# Patient Record
Sex: Female | Born: 1955 | Race: White | Hispanic: No | Marital: Single | Smoking: Never smoker
Health system: Southern US, Community
[De-identification: ages and names within clinical notes are randomized; demographics above are authoritative.]

## PROBLEM LIST (undated history)

## (undated) DIAGNOSIS — I5189 Other ill-defined heart diseases: Secondary | ICD-10-CM

## (undated) DIAGNOSIS — H524 Presbyopia: Secondary | ICD-10-CM

## (undated) DIAGNOSIS — M545 Low back pain, unspecified: Secondary | ICD-10-CM

## (undated) DIAGNOSIS — R319 Hematuria, unspecified: Secondary | ICD-10-CM

## (undated) HISTORY — DX: Other ill-defined heart diseases: I51.89

## (undated) HISTORY — DX: Presbyopia: H52.4

## (undated) HISTORY — DX: Low back pain, unspecified: M54.50

## (undated) HISTORY — DX: Hematuria, unspecified: R31.9

---

## 2000-08-17 ENCOUNTER — Other Ambulatory Visit: Admission: RE | Admit: 2000-08-17 | Discharge: 2000-08-17 | Payer: Self-pay | Admitting: Obstetrics & Gynecology

## 2002-01-04 ENCOUNTER — Other Ambulatory Visit: Admission: RE | Admit: 2002-01-04 | Discharge: 2002-01-04 | Payer: Self-pay | Admitting: Gynecology

## 2003-03-14 ENCOUNTER — Other Ambulatory Visit: Admission: RE | Admit: 2003-03-14 | Discharge: 2003-03-14 | Payer: Self-pay | Admitting: Gynecology

## 2004-03-16 ENCOUNTER — Other Ambulatory Visit: Admission: RE | Admit: 2004-03-16 | Discharge: 2004-03-16 | Payer: Self-pay | Admitting: Gynecology

## 2004-05-11 ENCOUNTER — Ambulatory Visit (HOSPITAL_COMMUNITY): Admission: RE | Admit: 2004-05-11 | Discharge: 2004-05-11 | Payer: Self-pay | Admitting: Gynecology

## 2005-03-17 ENCOUNTER — Other Ambulatory Visit: Admission: RE | Admit: 2005-03-17 | Discharge: 2005-03-17 | Payer: Self-pay | Admitting: Gynecology

## 2005-05-12 ENCOUNTER — Ambulatory Visit (HOSPITAL_COMMUNITY): Admission: RE | Admit: 2005-05-12 | Discharge: 2005-05-12 | Payer: Self-pay | Admitting: Gynecology

## 2006-04-21 ENCOUNTER — Other Ambulatory Visit: Admission: RE | Admit: 2006-04-21 | Discharge: 2006-04-21 | Payer: Self-pay | Admitting: Gynecology

## 2007-01-02 ENCOUNTER — Emergency Department (HOSPITAL_COMMUNITY): Admission: EM | Admit: 2007-01-02 | Discharge: 2007-01-02 | Payer: Self-pay | Admitting: Family Medicine

## 2007-06-09 ENCOUNTER — Other Ambulatory Visit: Admission: RE | Admit: 2007-06-09 | Discharge: 2007-06-09 | Payer: Self-pay | Admitting: Gynecology

## 2008-07-24 ENCOUNTER — Emergency Department (HOSPITAL_COMMUNITY): Admission: EM | Admit: 2008-07-24 | Discharge: 2008-07-24 | Payer: Self-pay | Admitting: Family Medicine

## 2008-08-29 ENCOUNTER — Ambulatory Visit: Payer: Self-pay | Admitting: Women's Health

## 2008-08-29 ENCOUNTER — Other Ambulatory Visit: Admission: RE | Admit: 2008-08-29 | Discharge: 2008-08-29 | Payer: Self-pay | Admitting: Gynecology

## 2008-08-29 ENCOUNTER — Encounter: Payer: Self-pay | Admitting: Women's Health

## 2008-08-29 ENCOUNTER — Ambulatory Visit (HOSPITAL_COMMUNITY): Admission: RE | Admit: 2008-08-29 | Discharge: 2008-08-29 | Payer: Self-pay | Admitting: Obstetrics and Gynecology

## 2009-03-11 ENCOUNTER — Ambulatory Visit: Payer: Self-pay | Admitting: Gynecology

## 2009-04-06 ENCOUNTER — Emergency Department (HOSPITAL_COMMUNITY): Admission: EM | Admit: 2009-04-06 | Discharge: 2009-04-06 | Payer: Self-pay | Admitting: Emergency Medicine

## 2009-04-10 ENCOUNTER — Ambulatory Visit: Payer: Self-pay | Admitting: Gynecology

## 2009-12-12 ENCOUNTER — Other Ambulatory Visit: Admission: RE | Admit: 2009-12-12 | Discharge: 2009-12-12 | Payer: Self-pay | Admitting: Gynecology

## 2009-12-12 ENCOUNTER — Ambulatory Visit: Payer: Self-pay | Admitting: Women's Health

## 2010-05-02 ENCOUNTER — Encounter: Payer: Self-pay | Admitting: Gynecology

## 2010-05-03 ENCOUNTER — Encounter: Payer: Self-pay | Admitting: Gynecology

## 2010-05-03 ENCOUNTER — Encounter: Payer: Self-pay | Admitting: Obstetrics and Gynecology

## 2010-06-28 IMAGING — MG MM DIGITAL SCREENING BILAT
1 series · 1 of 1 positions shown · non-contrast
Comparison: Prior studies.

DG SCREEN MAMMOGRAM BILATERAL
Bilateral CC and MLO view(s) were taken.
Technologist: Torrens, Daphne.(INGA)(M)

DIGITAL SCREENING MAMMOGRAM WITH CAD:

[L MLO]
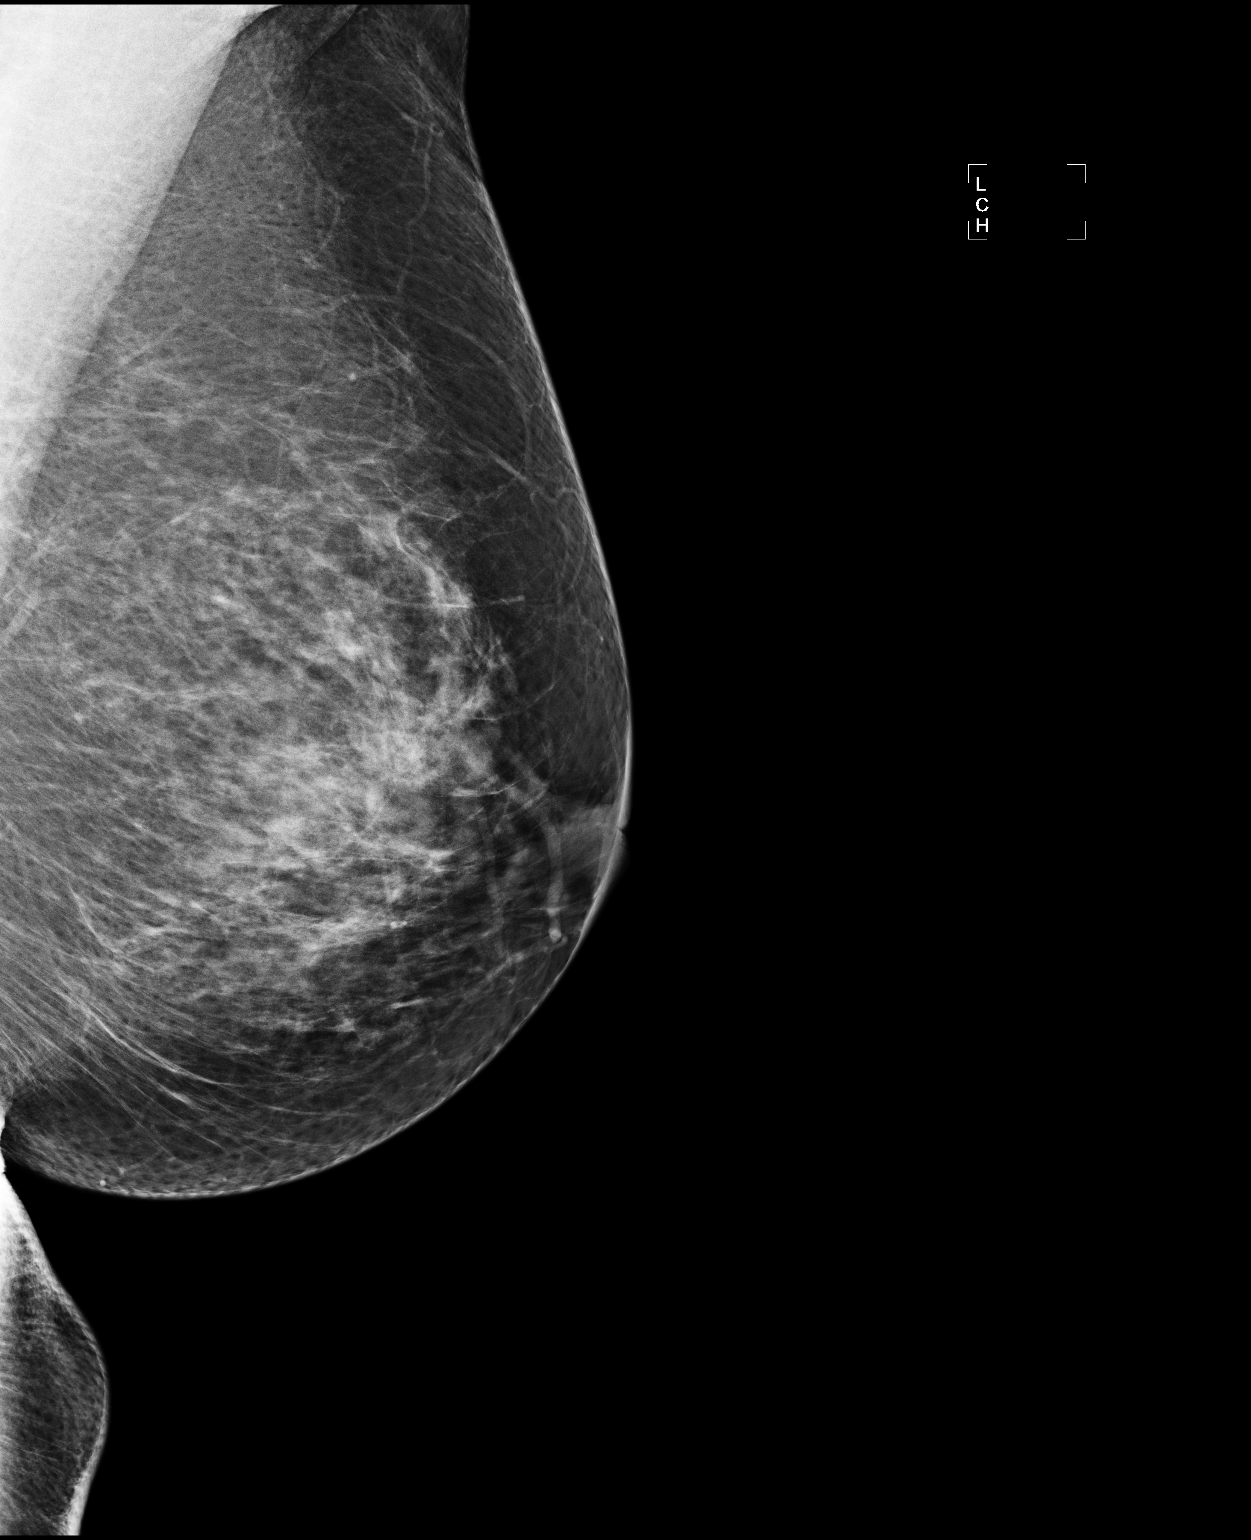

[1 of 1 positions shown; findings below may reference images not displayed]

The breast tissue is heterogeneously dense.  There is no dominant mass, architectural distortion or
calcification to suggest malignancy.
IMPRESSION: No mammographic evidence of malignancy.  Suggest yearly screening mammography.

A result letter of this screening mammogram will be mailed directly to the patient.

ASSESSMENT: Negative - BI-RADS 1

Screening mammogram in 1 year.
ANALYZED BY COMPUTER AIDED DETECTION. , THIS PROCEDURE WAS A DIGITAL MAMMOGRAM.

## 2010-07-13 LAB — POCT PREGNANCY, URINE: Preg Test, Ur: NEGATIVE

## 2010-09-25 ENCOUNTER — Ambulatory Visit: Payer: Self-pay | Admitting: Family Medicine

## 2011-01-21 LAB — POCT URINALYSIS DIP (DEVICE)
Ketones, ur: NEGATIVE
Operator id: 235561
Protein, ur: 30 — AB
Urobilinogen, UA: 0.2
pH: 7

## 2011-03-10 ENCOUNTER — Encounter: Payer: Self-pay | Admitting: *Deleted

## 2011-03-11 ENCOUNTER — Ambulatory Visit (INDEPENDENT_AMBULATORY_CARE_PROVIDER_SITE_OTHER): Payer: Managed Care, Other (non HMO) | Admitting: Women's Health

## 2011-03-11 ENCOUNTER — Other Ambulatory Visit (HOSPITAL_COMMUNITY)
Admission: RE | Admit: 2011-03-11 | Discharge: 2011-03-11 | Disposition: A | Discharge status: Home / Self Care | Payer: Managed Care, Other (non HMO) | Source: Ambulatory Visit | Attending: Obstetrics and Gynecology | Admitting: Obstetrics and Gynecology

## 2011-03-11 ENCOUNTER — Encounter: Payer: Self-pay | Admitting: Women's Health

## 2011-03-11 VITALS — BP 120/70 | Ht 62.0 in | Wt 180.0 lb

## 2011-03-11 DIAGNOSIS — Z01419 Encounter for gynecological examination (general) (routine) without abnormal findings: Secondary | ICD-10-CM | POA: Insufficient documentation

## 2011-03-11 DIAGNOSIS — Z780 Asymptomatic menopausal state: Secondary | ICD-10-CM

## 2011-03-11 DIAGNOSIS — N951 Menopausal and female climacteric states: Secondary | ICD-10-CM

## 2011-03-11 LAB — HM PAP SMEAR: HM PAP: NEGATIVE

## 2011-03-11 MED ORDER — ESTRADIOL-NORETHINDRONE ACET 0.5-0.1 MG PO TABS: 1.0000 | ORAL_TABLET | Freq: Every day | ORAL | Status: DC

## 2011-03-11 NOTE — Patient Instructions (Signed)
tdap vaccine  Colonoscopy-schedule

## 2011-03-11 NOTE — Progress Notes (Signed)
Emerson Barretto [facility] 02-27-1956 [id number]    History:    The patient presents for annual exam.  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   Past medical history, past surgical history, family history and social history were all reviewed and documented in the EPIC chart.   ROS:  A  ROS was performed and pertinent positives and negatives are included in the history.  Exam:  Filed Vitals:   03/11/11 1430  BP: 120/70    General appearance:  Normal Head/Neck:  Normal, without cervical or supraclavicular adenopathy. Thyroid:  Symmetrical, normal in size, without palpable masses or nodularity. Respiratory  Effort:  Normal  Auscultation:  Clear without wheezing or rhonchi Cardiovascular  Auscultation:  Regular rate, without rubs, murmurs or gallops  Edema/varicosities:  Not grossly evident Abdominal  Soft,nontender, without masses, guarding or rebound.  Liver/spleen:  No organomegaly noted  Hernia:  None appreciated  Skin  Inspection:  Grossly normal  Palpation:  Grossly normal Neurologic/psychiatric  Orientation:  Normal with appropriate conversation.  Mood/affect:  Normal  Genitourinary    Breasts: Examined lying and sitting.     Right: Without masses, retractions, discharge or axillary adenopathy.     Left: Without masses, retractions, discharge or axillary adenopathy.   Inguinal/mons:  Normal without inguinal adenopathy  External genitalia:  Normal  BUS/Urethra/Skene's glands:  Normal  Bladder:  Normal  Vagina:  Normal  Cervix:  Normal  Uterus:   Fibroid/8week size,  Midline and mobile  Adnexa/parametria:     Rt: Without masses or tenderness.   Lt: Without masses or tenderness.  Anus and perineum: Normal  Digital rectal exam: Normal sphincter tone without palpated masses or tenderness  Assessment/Plan:  55 y.o. S WF G0 for annual exam with no complaints. Postmenopausal on HRT with no bleeding. Has not had a colonoscopy. History of normal mammograms and Paps.  Normal  postmenopausal exam on HRT  Plan: Activella 0.5/0.1 prescription, proper use, slight risk for blood clots, strokes, breast cancer reviewed. Overdue for mammogram scheduled next week, reviewed importance of annual screening. Has a dermatology skin check appointment scheduled. Colonoscopy importance of screening reviewed, states will schedule next year. SBEs, annual screening, calcium rich diet, vitamin D 2000 daily encouraged, exercise, decrease calories for weight loss. DEXA will schedule. Pap only. Labs at primary care. Encouraged flu vaccine declines. Reviewed Tdap vaccine.  Harrington Challenger [facility], 3:13 PM 03/11/2011

## 2012-03-11 ENCOUNTER — Other Ambulatory Visit: Payer: Self-pay | Admitting: Women's Health

## 2012-03-13 NOTE — Telephone Encounter (Signed)
rx called in KW 

## 2012-06-15 ENCOUNTER — Encounter: Payer: Managed Care, Other (non HMO) | Admitting: Women's Health

## 2016-09-14 ENCOUNTER — Ambulatory Visit (INDEPENDENT_AMBULATORY_CARE_PROVIDER_SITE_OTHER): Payer: BLUE CROSS/BLUE SHIELD | Admitting: Adult Health

## 2016-09-14 ENCOUNTER — Encounter: Payer: Self-pay | Admitting: Adult Health

## 2016-09-14 VITALS — BP 123/76 | HR 69 | Ht 62.0 in | Wt 191.2 lb

## 2016-09-14 DIAGNOSIS — Z8349 Family history of other endocrine, nutritional and metabolic diseases: Secondary | ICD-10-CM

## 2016-09-14 DIAGNOSIS — Z833 Family history of diabetes mellitus: Secondary | ICD-10-CM | POA: Diagnosis not present

## 2016-09-14 DIAGNOSIS — Z1231 Encounter for screening mammogram for malignant neoplasm of breast: Secondary | ICD-10-CM

## 2016-09-14 DIAGNOSIS — E669 Obesity, unspecified: Secondary | ICD-10-CM | POA: Diagnosis not present

## 2016-09-14 DIAGNOSIS — Z83438 Family history of other disorder of lipoprotein metabolism and other lipidemia: Secondary | ICD-10-CM | POA: Insufficient documentation

## 2016-09-14 DIAGNOSIS — Z1239 Encounter for other screening for malignant neoplasm of breast: Secondary | ICD-10-CM | POA: Insufficient documentation

## 2016-09-14 DIAGNOSIS — R5383 Other fatigue: Secondary | ICD-10-CM | POA: Diagnosis not present

## 2016-09-14 NOTE — Progress Notes (Signed)
Subjective:    Patient ID: Michelle Wheeler, female    DOB: 04/28/1955, 61 y.o.   MRN: [id number]  HPI:  Ms. Michelle Wheeler presents to establish as a new pt.  She is a very pleasant 61 year old female.  PMH:  Denies chronic medical conditions or daily Rx medications.  She only takes OTC MVI and Vit d.  She has been a vegetarian for > 30 years.  She denies ETOH/Tobacco use.  She only drinks approx. 20 ounces of water daily, remainder of fluid intake is "1/2 sweetened tea".  She denies formal exercise, however is active with carrying for 3 year niece and works FT in [employer].  She is planning on starting a regular walking program. She has family hx of Diabetes and would like her A1c checked at her CPE.  Patient Care Team    Relationship Specialty Notifications Start End  WoodlawnDanford, Jinny BlossomKaty D, NP PCP - General Family Medicine  09/14/16     There are no active problems to display for this patient.    No past medical history on file.   No past surgical history on file.   Family History  Problem Relation Age of Onset  . Diabetes Father   . Cancer Father        kidney  . Hyperlipidemia Father   . Hypertension Father   . Diabetes Paternal Uncle   . Hypertension Paternal Uncle   . Diabetes Mother   . Diabetes Paternal Aunt   . Diabetes Paternal Uncle   . Hypertension Paternal Uncle   . Diabetes Paternal Uncle   . Hypertension Paternal Uncle      History  Drug Use No     History  Alcohol Use No     History  Smoking Status  . Never Smoker  Smokeless Tobacco  . Never Used     Outpatient Encounter Prescriptions as of 09/14/2016  Medication Sig  . Cholecalciferol (VITAMIN D3) 2000 units TABS Take 1 tablet by mouth daily.  . Multiple Vitamin (MULTIVITAMIN) tablet Take 1 tablet by mouth daily.  . [DISCONTINUED] Estradiol-Norethindrone Acet 0.5-0.1 MG per tablet TAKE 1 TABLET BY MOUTH DAILY.   No facility-administered encounter medications on file as of 09/14/2016.      Allergies: Codeine  Body mass index is 34.97 kg/m.  Blood pressure 123/76, pulse 69, height 5\' 2"  (1.575 m), weight 191 lb 3.2 oz (86.7 kg).     Review of Systems  Constitutional: Positive for fatigue. Negative for activity change, appetite change, chills, diaphoresis, fever and unexpected weight change.  Eyes: Negative for visual disturbance.  Respiratory: Negative for cough, chest tightness, shortness of breath, wheezing and stridor.   Cardiovascular: Negative for chest pain, palpitations and leg swelling.  Gastrointestinal: Negative for abdominal distention, abdominal pain, blood in stool, constipation, diarrhea, nausea and vomiting.  Endocrine: Negative for cold intolerance, heat intolerance, polydipsia, polyphagia and polyuria.  Genitourinary: Negative for difficulty urinating, flank pain and hematuria.  Musculoskeletal: Negative for arthralgias, back pain, gait problem, joint swelling, myalgias, neck pain and neck stiffness.  Skin: Negative for color change, pallor, rash and wound.  Allergic/Immunologic: Negative for immunocompromised state.  Neurological: Positive for headaches. Negative for weakness.       Hx of migraines, however has improved over lifetime.  Estimates 1-2 /annually  Hematological: Does not bruise/bleed easily.  Psychiatric/Behavioral: Negative for dysphoric mood and sleep disturbance.       Objective:   Physical Exam  Constitutional: She is oriented to person, place,  and time. She appears well-developed and well-nourished. No distress.  HENT:  Head: Normocephalic and atraumatic.  Right Ear: Hearing, tympanic membrane, external ear and ear canal normal.  Left Ear: Hearing, tympanic membrane, external ear and ear canal normal.  Nose: Nose normal. Right sinus exhibits no maxillary sinus tenderness and no frontal sinus tenderness. Left sinus exhibits no maxillary sinus tenderness and no frontal sinus tenderness.  Mouth/Throat: Uvula is midline.  Eyes:  Conjunctivae are normal. Pupils are equal, round, and reactive to light.  Neck: Normal range of motion. Neck supple.  Cardiovascular: Normal rate, regular rhythm, normal heart sounds and intact distal pulses.   No murmur heard. Pulmonary/Chest: Breath sounds normal. No respiratory distress. She has no wheezes. She has no rales. She exhibits no tenderness.  Musculoskeletal: Normal range of motion.  Lymphadenopathy:    She has no cervical adenopathy.  Neurological: She is alert and oriented to person, place, and time. Coordination normal.  Skin: Skin is warm and dry. No rash noted. She is not diaphoretic. No erythema. No pallor.  Psychiatric: She has a normal mood and affect. Her behavior is normal. Judgment and thought content normal.  Nursing note and vitals reviewed.         Assessment & Plan:   1. Screening for breast cancer   2. Family history of diabetes mellitus in father   3. Family history of hyperlipidemia   4. Other fatigue   5. Obesity (BMI 30.0-34.9)     Obesity (BMI 30.0-34.9) Continue vegetarian diet. Increase regular movement to at least 16mins/5 times daily. Increase water intake to at least 80 ounces daily.   Family history of hyperlipidemia Will check fasting lipid panel at CPE. Has been following vegetarian diet for > 30 years.   Other fatigue Will check Vit d, TSH level at CPE  Family history of diabetes mellitus in father Will check A1c at CPE  Screening for breast cancer Mammogram order placed.    FOLLOW-UP:  Return in about 1 year (around 09/14/2017) for Regular Follow Up, CPE, Lab Work.

## 2016-09-14 NOTE — Assessment & Plan Note (Signed)
Will check A1c at CPE 

## 2016-09-14 NOTE — Assessment & Plan Note (Signed)
Will check Vit d, TSH level at CPE

## 2016-09-14 NOTE — Assessment & Plan Note (Signed)
Will check fasting lipid panel at CPE. Has been following vegetarian diet for > 30 years.

## 2016-09-14 NOTE — Assessment & Plan Note (Signed)
Continue vegetarian diet. Increase regular movement to at least 2930mins/5 times daily. Increase water intake to at least 80 ounces daily.

## 2016-09-14 NOTE — Patient Instructions (Signed)
Exercising to Lose Weight Exercising can help you to lose weight. In order to lose weight through exercise, you need to do vigorous-intensity exercise. You can tell that you are exercising with vigorous intensity if you are breathing very hard and fast and cannot hold a conversation while exercising. Moderate-intensity exercise helps to maintain your current weight. You can tell that you are exercising at a moderate level if you have a higher heart rate and faster breathing, but you are still able to hold a conversation. How often should I exercise? Choose an activity that you enjoy and set realistic goals. Your health care provider can help you to make an activity plan that works for you. Exercise regularly as directed by your health care provider. This may include:  Doing resistance training twice each week, such as: ? Push-ups. ? Sit-ups. ? Lifting weights. ? Using resistance bands.  Doing a given intensity of exercise for a given amount of time. Choose from these options: ? 150 minutes of moderate-intensity exercise every week. ? 75 minutes of vigorous-intensity exercise every week. ? A mix of moderate-intensity and vigorous-intensity exercise every week.  Children, pregnant women, people who are out of shape, people who are overweight, and older adults may need to consult a health care provider for individual recommendations. If you have any sort of medical condition, be sure to consult your health care provider before starting a new exercise program. What are some activities that can help me to lose weight?  Walking at a rate of at least 4.5 miles an hour.  Jogging or running at a rate of 5 miles per hour.  Biking at a rate of at least 10 miles per hour.  Lap swimming.  Roller-skating or in-line skating.  Cross-country skiing.  Vigorous competitive sports, such as football, basketball, and soccer.  Jumping rope.  Aerobic dancing. How can I be more active in my day-to-day  activities?  Use the stairs instead of the elevator.  Take a walk during your lunch break.  If you drive, park your car farther away from work or school.  If you take public transportation, get off one stop early and walk the rest of the way.  Make all of your phone calls while standing up and walking around.  Get up, stretch, and walk around every 30 minutes throughout the day. What guidelines should I follow while exercising?  Do not exercise so much that you hurt yourself, feel dizzy, or get very short of breath.  Consult your health care provider prior to starting a new exercise program.  Wear comfortable clothes and shoes with good support.  Drink plenty of water while you exercise to prevent dehydration or heat stroke. Body water is lost during exercise and must be replaced.  Work out until you breathe faster and your heart beats faster. This information is not intended to replace advice given to you by your health care provider. Make sure you discuss any questions you have with your health care provider. Document Released: 05/01/2010 Document Revised: 09/04/2015 Document Reviewed: 08/30/2013 Elsevier Interactive Patient Education  2018 [employer]  Overall GREAT JOB on healthy lifestyle! Increase regular movement and increase water consumption to at least 80 ounces daily. Please schedule fasting lab and complete physical at your convenience. VERY NICE TO MEET YOU!

## 2016-09-14 NOTE — Assessment & Plan Note (Signed)
Mammogram order placed

## 2016-09-24 ENCOUNTER — Other Ambulatory Visit: Payer: Managed Care, Other (non HMO)

## 2017-05-16 NOTE — Progress Notes (Signed)
This encounter was created in error - please disregard.

## 2017-05-17 ENCOUNTER — Ambulatory Visit: Payer: 59

## 2017-05-17 ENCOUNTER — Encounter (INDEPENDENT_AMBULATORY_CARE_PROVIDER_SITE_OTHER): Payer: Self-pay | Admitting: Podiatry

## 2017-05-17 DIAGNOSIS — M722 Plantar fascial fibromatosis: Secondary | ICD-10-CM

## 2017-07-06 ENCOUNTER — Ambulatory Visit: Payer: 59 | Admitting: Women's Health

## 2017-07-07 ENCOUNTER — Telehealth (HOSPITAL_COMMUNITY): Payer: Self-pay

## 2017-07-07 ENCOUNTER — Ambulatory Visit (INDEPENDENT_AMBULATORY_CARE_PROVIDER_SITE_OTHER): Payer: 59 | Admitting: Adult Health

## 2017-07-07 ENCOUNTER — Encounter: Payer: Self-pay | Admitting: Adult Health

## 2017-07-07 VITALS — BP 134/82 | HR 62 | Ht 62.0 in | Wt 191.6 lb

## 2017-07-07 DIAGNOSIS — R079 Chest pain, unspecified: Secondary | ICD-10-CM | POA: Diagnosis not present

## 2017-07-07 DIAGNOSIS — S29011A Strain of muscle and tendon of front wall of thorax, initial encounter: Secondary | ICD-10-CM

## 2017-07-07 LAB — EKG 12-LEAD

## 2017-07-07 MED ORDER — CYCLOBENZAPRINE HCL 10 MG PO TABS: 10.0000 mg | ORAL_TABLET | Freq: Three times a day (TID) | ORAL | 0 refills | Status: DC | PRN

## 2017-07-07 NOTE — Telephone Encounter (Signed)
Encounter complete. 

## 2017-07-07 NOTE — Assessment & Plan Note (Signed)
Please continue Ibuprofen as directed by manufacturer's instructions! Please use Cyclobenzaprine as needed.

## 2017-07-07 NOTE — Assessment & Plan Note (Addendum)
EKG- normal with non-specific T wave abnormality Cardiac Stress Test Ordered No previous tracings to compare to If you notice and Red Flag symptoms (ie severe chest pain, nausea, sweating, pain in jaw, pain between shoulder blades, pain radiating down arms) then please seek immediate medical care. Please call clinic with any questions/concerns.

## 2017-07-07 NOTE — Progress Notes (Signed)
Subjective:    Patient ID: Michelle Wheeler, female    DOB: [birth year], 62 y.o.   MRN: [id number]  HPI:  Michelle Wheeler presents with central chest pain that suddenly started at 0300. Pain first started in chest then radiated to middle abdomen, described as "sharp", rated 10/10.  She denies diaphoresis or pain in jaw, between shoulder blades or arms. She did experience brief nausea without vomiting. She took 2 Advil at 0900, pain now 3/10 and just localized over central chest. She reports eating single serving of yogurt just prior to bed. She reports "being jerked by my dog" yesterday while walking him.  She has a 65 lb herding dog. She denies any tobacco use or current EOTH use. She denies this ever occurring before. She reports her mother had PCI with stent about 6 months ago, at age 1.  Mother has advanced COPD/emphysema and is one oxygen therapy 24/7. She denies palpitations/dyspnea at current. EKG- NSR with nonspecific T wave abnormality Recommend Stress Test- order placed  Patient Care Team    Relationship Specialty Notifications Start End  Julaine Fusi, NP PCP - General Family Medicine  09/14/16     Patient Active Problem List   Diagnosis Date Noted  . Chest pain 07/07/2017  . Chest wall muscle strain 07/07/2017  . Obesity (BMI 30.0-34.9) 09/14/2016  . Family history of hyperlipidemia 09/14/2016  . Other fatigue 09/14/2016  . Family history of diabetes mellitus in father 09/14/2016  . Screening for breast cancer 09/14/2016     History reviewed. No pertinent past medical history.   History reviewed. No pertinent surgical history.   Family History  Problem Relation Age of Onset  . Diabetes Father   . Cancer Father        kidney  . Hyperlipidemia Father   . Hypertension Father   . Diabetes Paternal Uncle   . Hypertension Paternal Uncle   . Diabetes Mother   . Diabetes Paternal Aunt   . Diabetes Paternal Uncle   . Hypertension Paternal Uncle   . Diabetes Paternal  Uncle   . Hypertension Paternal Uncle      Social History   Substance and Sexual Activity  Drug Use No     Social History   Substance and Sexual Activity  Alcohol Use No     Social History   Tobacco Use  Smoking Status Never Smoker  Smokeless Tobacco Never Used     Outpatient Encounter Medications as of 07/07/2017  Medication Sig  . Cholecalciferol (VITAMIN D3) 2000 units TABS Take 1 tablet by mouth daily.  . Multiple Vitamin (MULTIVITAMIN) tablet Take 1 tablet by mouth daily.  . norethindrone-ethinyl estradiol (JINTELI) 1-5 MG-MCG TABS tablet   . cyclobenzaprine (FLEXERIL) 10 MG tablet Take 1 tablet (10 mg total) by mouth 3 (three) times daily as needed for muscle spasms.   No facility-administered encounter medications on file as of 07/07/2017.     Allergies: Codeine  Body mass index is 35.04 kg/m.  Blood pressure 134/82, pulse 62, height 5\' 2"  (1.575 m), weight 191 lb 9.6 oz (86.9 kg), last menstrual period 04/09/2010, SpO2 98 %.    Review of Systems  Constitutional: Positive for activity change and fatigue. Negative for appetite change, chills, diaphoresis, fever and unexpected weight change.  Eyes: Negative for visual disturbance.  Respiratory: Positive for chest tightness. Negative for cough, shortness of breath, wheezing and stridor.   Cardiovascular: Positive for chest pain. Negative for palpitations and leg swelling.  Gastrointestinal: Negative for  abdominal distention, abdominal pain, blood in stool, constipation, diarrhea, nausea and vomiting.  Genitourinary: Negative for difficulty urinating and flank pain.  Musculoskeletal: Positive for myalgias.  Neurological: Negative for dizziness and headaches.  Hematological: Does not bruise/bleed easily.       Objective:   Physical Exam  Constitutional: She is oriented to person, place, and time. She appears well-developed and well-nourished.  Non-toxic appearance. She does not have a sickly appearance.  She does not appear ill. She appears distressed.  HENT:  Head: Normocephalic and atraumatic.  Right Ear: External ear normal.  Left Ear: External ear normal.  Eyes: Pupils are equal, round, and reactive to light. Conjunctivae are normal.  Cardiovascular: Normal rate, regular rhythm, normal heart sounds and intact distal pulses.  No murmur heard. Pulmonary/Chest: Effort normal and breath sounds normal. No respiratory distress. She has no wheezes. She has no rales. She exhibits no tenderness.  Abdominal: Soft. Bowel sounds are normal. She exhibits no distension and no mass. There is no tenderness. There is no rebound and no guarding.  Musculoskeletal: She exhibits tenderness.  Tenderness over medial pectoralis major with palpation   Neurological: She is alert and oriented to person, place, and time.  Skin: Skin is warm and dry. No rash noted. She is not diaphoretic. No erythema. No pallor.  Psychiatric: She has a normal mood and affect. Her behavior is normal. Judgment and thought content normal.  Nursing note and vitals reviewed.      Assessment & Plan:   1. Chest pain, unspecified type   2. Muscle strain of chest wall, initial encounter     Chest pain EKG- normal with non-specific T wave abnormality Cardiac Stress Test Ordered No previous tracings to compare to If you notice and Red Flag symptoms (ie severe chest pain, nausea, sweating, pain in jaw, pain between shoulder blades, pain radiating down arms) then please seek immediate medical care. Please call clinic with any questions/concerns.  Chest wall muscle strain Please continue Ibuprofen as directed by manufacturer's instructions! Please use Cyclobenzaprine as needed.   Pt was in the office today for 25+ minutes, with over 50% time spent in face to face counseling of patient's various medical conditions and in coordination of care  FOLLOW-UP:  Return if symptoms worsen or fail to improve.  CPE with fasting labs  09/2017

## 2017-07-07 NOTE — Patient Instructions (Signed)
Pectoralis Major Rupture A pectoralis major rupture is a tear (rupture) in one of the muscles in the chest (pectoralis major). There are two of these muscles in the chest, one on each side. The pectoralis major muscleshelp to straighten the arms and push forward. A pectoralis major rupture often tears the muscle away from bone along the breastbone, collarbone, or upper arm. This injury causes pain and weakness in the chest and shoulder. What are the causes? This condition is caused by putting too much stress on the pectoralis major muscle. This is most commonly a result of weight lifting. What increases the risk? This condition is more likely to develop in men, especially men who use anabolic steroids. This condition is also more likely to develop in people who participate in any of the following activities:  Weight lifting.  Rugby.  Football.  Gymnastics.  Skiing.  Wrestling.  Boxing.  Hockey.  What are the signs or symptoms? At the time of the rupture, you may hear a pop and feel a tearing pain. Other signs and symptoms may include:  Swelling.  Bruising.  Weakness.  A bulge or abnormality in the muscle (deformity).  Pain and tenderness when pressing on the muscle.  How is this diagnosed? This condition may be diagnosed based on:  Your symptoms.  Your medical history.  A physical exam. Your health care provider may: ? Compare one side of your chest to the other. ? Check for weakness, bruising, and tenderness.  Imaging tests to find the exact location of the rupture and check how severe it is. Tests may include: ? MRI. ? Ultrasound.  How is this treated? In young, active people and athletes, this condition is usually treated with surgery to reattach the muscle and repair the rupture. If your rupture is minor, or if you are older, you may not need surgery. Non-surgical treatment may include:  Wearing a sling for 3-6 weeks to keep your arm still  (immobilization).  NSAIDs to help relieve pain and swelling.  Physical therapy to improve your range of motion and strength.  Follow these instructions at home: If you have a sling:  Wear it as told by your health care provider. Remove it only as told by your health care provider.  Loosen the sling if your fingers tingle, become numb, or turn cold and blue.  Do not let your sling get wet if it is not waterproof.  Keep the sling clean. Bathing  Do not take baths, swim, or use a hot tub until your health care provider approves. Ask your health care provider if you can take showers. You may only be allowed to take sponge baths for bathing.  If your sling is not waterproof, cover it with a watertight covering when you take a bath or a shower. Managing pain, stiffness, and swelling   If directed, apply ice to your injured area. ? Put ice in a plastic bag. ? Place a towel between your skin and the bag. ? Leave the ice on for 20 minutes, 2-3 times a day.  Move your fingers often to avoid stiffness and to lessen swelling. Driving  Do not drive or operate heavy machinery while taking prescription pain medicine.  Ask your health care provider when it is safe to drive if you have a sling on your arm. Activity  Return to your normal activities as told by your health care provider. Ask your health care provider what activities are safe for you.  Do exercises as told by  your health care provider. General instructions  Take over-the-counter and prescription medicines only as told by your health care provider.  Keep all follow-up visits as told by your health care provider. This is important. How is this prevented?  Do not use anabolic steroids.  If you start a weight lifting program, make sure that you have supervision. As your fitness improves, you may add weight gradually.  Warm up and stretch before being active.  Cool down and stretch after being active.  Give your body  time to rest between periods of activity.  Make sure to use equipment that fits you.  Be safe and responsible while being active to avoid falls.  Maintain physical fitness, including: ? Strength. ? Flexibility. ? Endurance. Contact a health care provider if:  You continue to have pain, swelling, or weakness after 4 weeks.  Exercising makes your symptoms worse. This information is not intended to replace advice given to you by your health care provider. Make sure you discuss any questions you have with your health care provider. Document Released: 03/29/2005 Document Revised: 01/08/2016 Document Reviewed: 12/15/2014 Elsevier Interactive Patient Education  2018 [employer].  Please continue Ibuprofen as directed by [employer] instructions! Please use Cyclobenzaprine as needed. EKG- normal with non-specific T wave abnormality Cardiac Stress Test Ordered If you notice and Red Flag symptoms (ie severe chest pain, nausea, sweating, pain in jaw, pain between shoulder blades, pain radiating down arms) then please seek immediate medical care. Please call clinic with any questions/concerns. FEEL BETTER!

## 2017-07-13 ENCOUNTER — Encounter (HOSPITAL_COMMUNITY): Payer: 59

## 2017-07-15 ENCOUNTER — Telehealth (HOSPITAL_COMMUNITY): Payer: Self-pay

## 2017-07-15 NOTE — Telephone Encounter (Signed)
Encounter complete. 

## 2017-07-20 ENCOUNTER — Ambulatory Visit (HOSPITAL_COMMUNITY)
Admission: RE | Admit: 2017-07-20 | Discharge: 2017-07-20 | Disposition: A | Discharge status: Home / Self Care | Payer: 59 | Source: Ambulatory Visit | Attending: Cardiovascular Disease | Admitting: Cardiovascular Disease

## 2017-07-20 DIAGNOSIS — R079 Chest pain, unspecified: Secondary | ICD-10-CM | POA: Diagnosis present

## 2017-07-20 LAB — MYOCARDIAL PERFUSION IMAGING
CHL CUP NUCLEAR SDS: 1
CHL CUP NUCLEAR SRS: 1
CHL CUP NUCLEAR SSS: 2
CHL CUP RESTING HR STRESS: 65 {beats}/min
CSEPEDS: 1 s
CSEPEW: 10.1 METS
CSEPHR: 91 %
CSEPPHR: 146 {beats}/min
Exercise duration (min): 8 min
LV dias vol: 80 mL (ref 46–106)
LVSYSVOL: 29 mL
MPHR: 159 {beats}/min
RPE: 17
TID: 1.05

## 2017-07-20 MED ORDER — TECHNETIUM TC 99M TETROFOSMIN IV KIT
30.5000 | PACK | Freq: Once | INTRAVENOUS | Status: AC | PRN
  Administered 2017-07-20: 30.5 via INTRAVENOUS
  Filled 2017-07-20: qty 31

## 2017-07-20 MED ORDER — TECHNETIUM TC 99M TETROFOSMIN IV KIT
10.6000 | PACK | Freq: Once | INTRAVENOUS | Status: AC | PRN
  Administered 2017-07-20: 10.6 via INTRAVENOUS
  Filled 2017-07-20: qty 11

## 2017-08-01 NOTE — Progress Notes (Signed)
Subjective:    Patient ID: Michelle Wheeler, female    DOB: 12/05/1955, 62 y.o.   MRN: [id number]  HPI:  Michelle Wheeler presents for CPE She denies acute complaints. She denies any CP since Perfusion study, again reviewed results with pt- EF: 64%, Low Risk Study She plans on starting a walking program and improving diet- she eats fast food and snacks high in saturated fat. She denies tobacco/ETOH use  Patient Care Team    Relationship Specialty Notifications Start End  Julaine Fusianford, Khalilah Hoke D, NP PCP - General Family Medicine  09/14/16     Patient Active Problem List   Diagnosis Date Noted  . Chest pain 07/07/2017  . Chest wall muscle strain 07/07/2017  . Obesity (BMI 30.0-34.9) 09/14/2016  . Family history of hyperlipidemia 09/14/2016  . Other fatigue 09/14/2016  . Family history of diabetes mellitus in father 09/14/2016  . Screening for breast cancer 09/14/2016     History reviewed. No pertinent past medical history.   History reviewed. No pertinent surgical history.   Family History  Problem Relation Age of Onset  . Diabetes Father   . Cancer Father        kidney  . Hyperlipidemia Father   . Hypertension Father   . Diabetes Paternal Uncle   . Hypertension Paternal Uncle   . Diabetes Mother   . Diabetes Paternal Aunt   . Diabetes Paternal Uncle   . Hypertension Paternal Uncle   . Diabetes Paternal Uncle   . Hypertension Paternal Uncle      Social History   Substance and Sexual Activity  Drug Use No     Social History   Substance and Sexual Activity  Alcohol Use No     Social History   Tobacco Use  Smoking Status Never Smoker  Smokeless Tobacco Never Used     Outpatient Encounter Medications as of 08/02/2017  Medication Sig  . Cholecalciferol (VITAMIN D3) 2000 units TABS Take 1 tablet by mouth daily.  . cyclobenzaprine (FLEXERIL) 10 MG tablet Take 1 tablet (10 mg total) by mouth 3 (three) times daily as needed for muscle spasms.  . Multiple Vitamin  (MULTIVITAMIN) tablet Take 1 tablet by mouth daily.  . norethindrone-ethinyl estradiol (JINTELI) 1-5 MG-MCG TABS tablet    No facility-administered encounter medications on file as of 08/02/2017.     Allergies: Codeine  Body mass index is 35.21 kg/m.  Blood pressure 124/73, pulse 70, height 5\' 2"  (1.575 m), weight 192 lb 8 oz (87.3 kg), last menstrual period 04/09/2010, SpO2 96 %.  Review of Systems  Constitutional: Positive for fatigue. Negative for activity change, appetite change, chills, diaphoresis, fever and unexpected weight change.  HENT: Negative for congestion.   Eyes: Negative for visual disturbance.  Respiratory: Negative for cough, chest tightness, shortness of breath and wheezing.   Cardiovascular: Negative for chest pain, palpitations and leg swelling.  Gastrointestinal: Negative for abdominal distention, abdominal pain, blood in stool, constipation, diarrhea, nausea and vomiting.  Endocrine: Negative for cold intolerance, heat intolerance, polydipsia, polyphagia and polyuria.  Genitourinary: Negative for difficulty urinating and flank pain.  Musculoskeletal: Negative for arthralgias, back pain, gait problem, joint swelling, myalgias, neck pain and neck stiffness.  Skin: Negative for color change, pallor, rash and wound.  Neurological: Negative for dizziness and headaches.  Hematological: Does not bruise/bleed easily.  Psychiatric/Behavioral: Negative for dysphoric mood, hallucinations, self-injury, sleep disturbance and suicidal ideas. The patient is not nervous/anxious and is not hyperactive.  Objective:   Physical Exam  Constitutional: She is oriented to person, place, and time. She appears well-developed and well-nourished. No distress.  HENT:  Head: Normocephalic and atraumatic.  Right Ear: External ear normal. Tympanic membrane is not erythematous and not bulging. No decreased hearing is noted.  Left Ear: External ear normal. Tympanic membrane is not  erythematous and not bulging. No decreased hearing is noted.  Nose: No mucosal edema or rhinorrhea. Right sinus exhibits no maxillary sinus tenderness and no frontal sinus tenderness. Left sinus exhibits no maxillary sinus tenderness and no frontal sinus tenderness.  Mouth/Throat: Uvula is midline, oropharynx is clear and moist and mucous membranes are normal.  Eyes: Pupils are equal, round, and reactive to light. Conjunctivae are normal.  Neck: Normal range of motion. Neck supple.  Cardiovascular: Normal rate, regular rhythm and intact distal pulses.  No murmur heard. Pulmonary/Chest: Effort normal and breath sounds normal. No respiratory distress. She has no wheezes. She has no rales. She exhibits no tenderness.  Abdominal: Soft. Bowel sounds are normal. She exhibits no distension and no mass. There is no tenderness. There is no rebound and no guarding.  Genitourinary: Vagina normal. No vaginal discharge found.  Genitourinary Comments: Chaperone present during examination.  Musculoskeletal: Normal range of motion. She exhibits no edema or tenderness.  Lymphadenopathy:    She has no cervical adenopathy.  Neurological: She is alert and oriented to person, place, and time. Coordination normal.  Skin: Skin is warm and dry. No rash noted. She is not diaphoretic. No erythema. No pallor.  Psychiatric: She has a normal mood and affect. Her behavior is normal. Judgment and thought content normal.  Nursing note and vitals reviewed.     Assessment & Plan:   1. Screening for colon cancer   2. Screening for cervical cancer   3. Healthcare maintenance   4. Screening for breast cancer     Healthcare maintenance Please schedule fasting lab appt at your convenience. Increase water intake, strive for at least 80 ounces/day.  Follow Heart Healthy diet Increase regular exercise.  Recommend at least 30 minutes daily, 5 days per week of walking, jogging, biking, swimming, YouTube/[employer] workout  videos. Recommend annual follow-up with fasting labs.  Screening for cervical cancer Declined colonoscopy, ordered Cologuard   Screening for breast cancer Mammogram ordered  FOLLOW-UP:  Return in about 1 year (around 08/03/2018) for Fasting Labs, CPE.

## 2017-08-02 ENCOUNTER — Encounter: Payer: Self-pay | Admitting: Adult Health

## 2017-08-02 ENCOUNTER — Other Ambulatory Visit (HOSPITAL_COMMUNITY)
Admission: RE | Admit: 2017-08-02 | Discharge: 2017-08-02 | Disposition: A | Discharge status: Home / Self Care | Payer: 59 | Source: Ambulatory Visit | Attending: Adult Health | Admitting: Adult Health

## 2017-08-02 ENCOUNTER — Ambulatory Visit (INDEPENDENT_AMBULATORY_CARE_PROVIDER_SITE_OTHER): Payer: 59 | Admitting: Adult Health

## 2017-08-02 VITALS — BP 124/73 | HR 70 | Ht 62.0 in | Wt 192.5 lb

## 2017-08-02 DIAGNOSIS — Z1211 Encounter for screening for malignant neoplasm of colon: Secondary | ICD-10-CM | POA: Diagnosis not present

## 2017-08-02 DIAGNOSIS — Z0000 Encounter for general adult medical examination without abnormal findings: Secondary | ICD-10-CM | POA: Diagnosis not present

## 2017-08-02 DIAGNOSIS — Z124 Encounter for screening for malignant neoplasm of cervix: Secondary | ICD-10-CM | POA: Insufficient documentation

## 2017-08-02 DIAGNOSIS — Z1231 Encounter for screening mammogram for malignant neoplasm of breast: Secondary | ICD-10-CM | POA: Diagnosis not present

## 2017-08-02 DIAGNOSIS — Z1239 Encounter for other screening for malignant neoplasm of breast: Secondary | ICD-10-CM

## 2017-08-02 NOTE — Patient Instructions (Signed)
Preventive Care for Adults, Female  A healthy lifestyle and preventive care can promote health and wellness. Preventive health guidelines for women include the following key practices.   A routine yearly physical is a good way to check with your health care provider about your health and preventive screening. It is a chance to share any concerns and updates on your health and to receive a thorough exam.   Visit your dentist for a routine exam and preventive care every 6 months. Brush your teeth twice a day and floss once a day. Good oral hygiene prevents tooth decay and gum disease.   The frequency of eye exams is based on your age, health, family medical history, use of contact lenses, and other factors. Follow your health care provider's recommendations for frequency of eye exams.   Eat a healthy diet. Foods like vegetables, fruits, whole grains, low-fat dairy products, and lean protein foods contain the nutrients you need without too many calories. Decrease your intake of foods high in solid fats, added sugars, and salt. Eat the right amount of calories for you.Get information about a proper diet from your health care provider, if necessary.   Regular physical exercise is one of the most important things you can do for your health. Most adults should get at least 150 minutes of moderate-intensity exercise (any activity that increases your heart rate and causes you to sweat) each week. In addition, most adults need muscle-strengthening exercises on 2 or more days a week.   Maintain a healthy weight. The body mass index (BMI) is a screening tool to identify possible weight problems. It provides an estimate of body fat based on height and weight. Your health care provider can find your BMI, and can help you achieve or maintain a healthy weight.For adults 20 years and older:   - A BMI below 18.5 is considered underweight.   - A BMI of 18.5 to 24.9 is normal.   - A BMI of 25 to 29.9 is  considered overweight.   - A BMI of 30 and above is considered obese.   Maintain normal blood lipids and cholesterol levels by exercising and minimizing your intake of trans and saturated fats.  Eat a balanced diet with plenty of fruit and vegetables. Blood tests for lipids and cholesterol should begin at age 20 and be repeated every 5 years minimum.  If your lipid or cholesterol levels are high, you are over 40, or you are at high risk for heart disease, you may need your cholesterol levels checked more frequently.Ongoing high lipid and cholesterol levels should be treated with medicines if diet and exercise are not working.   If you smoke, find out from your health care provider how to quit. If you do not use tobacco, do not start.   Lung cancer screening is recommended for adults aged 55-80 years who are at high risk for developing lung cancer because of a history of smoking. A yearly low-dose CT scan of the lungs is recommended for people who have at least a 30-pack-year history of smoking and are a current smoker or have quit within the past 15 years. A pack year of smoking is smoking an average of 1 pack of cigarettes a day for 1 year (for example: 1 pack a day for 30 years or 2 packs a day for 15 years). Yearly screening should continue until the smoker has stopped smoking for at least 15 years. Yearly screening should be stopped for people who develop a   health problem that would prevent them from having lung cancer treatment.   If you are pregnant, do not drink alcohol. If you are breastfeeding, be very cautious about drinking alcohol. If you are not pregnant and choose to drink alcohol, do not have more than 1 drink per day. One drink is considered to be 12 ounces (355 mL) of beer, 5 ounces (148 mL) of wine, or 1.5 ounces (44 mL) of liquor.   Avoid use of street drugs. Do not share needles with anyone. Ask for help if you need support or instructions about stopping the use of  drugs.   High blood pressure causes heart disease and increases the risk of stroke. Your blood pressure should be checked at least yearly.  Ongoing high blood pressure should be treated with medicines if weight loss and exercise do not work.   If you are 69-55 years old, ask your health care provider if you should take aspirin to prevent strokes.   Diabetes screening involves taking a blood sample to check your fasting blood sugar level. This should be done once every 3 years, after age 38, if you are within normal weight and without risk factors for diabetes. Testing should be considered at a younger age or be carried out more frequently if you are overweight and have at least 1 risk factor for diabetes.   Breast cancer screening is essential preventive care for women. You should practice "breast self-awareness."  This means understanding the normal appearance and feel of your breasts and may include breast self-examination.  Any changes detected, no matter how small, should be reported to a health care provider.  Women in their 80s and 30s should have a clinical breast exam (CBE) by a health care provider as part of a regular health exam every 1 to 3 years.  After age 66, women should have a CBE every year.  Starting at age 1, women should consider having a mammogram (breast X-ray test) every year.  Women who have a family history of breast cancer should talk to their health care provider about genetic screening.  Women at a high risk of breast cancer should talk to their health care providers about having an MRI and a mammogram every year.   -Breast cancer gene (BRCA)-related cancer risk assessment is recommended for women who have family members with BRCA-related cancers. BRCA-related cancers include breast, ovarian, tubal, and peritoneal cancers. Having family members with these cancers may be associated with an increased risk for harmful changes (mutations) in the breast cancer genes BRCA1 and  BRCA2. Results of the assessment will determine the need for genetic counseling and BRCA1 and BRCA2 testing.   The Pap test is a screening test for cervical cancer. A Pap test can show cell changes on the cervix that might become cervical cancer if left untreated. A Pap test is a procedure in which cells are obtained and examined from the lower end of the uterus (cervix).   - Women should have a Pap test starting at age 57.   - Between ages 90 and 70, Pap tests should be repeated every 2 years.   - Beginning at age 63, you should have a Pap test every 3 years as long as the past 3 Pap tests have been normal.   - Some women have medical problems that increase the chance of getting cervical cancer. Talk to your health care provider about these problems. It is especially important to talk to your health care provider if a  new problem develops soon after your last Pap test. In these cases, your health care provider may recommend more frequent screening and Pap tests.   - The above recommendations are the same for women who have or have not gotten the vaccine for human papillomavirus (HPV).   - If you had a hysterectomy for a problem that was not cancer or a condition that could lead to cancer, then you no longer need Pap tests. Even if you no longer need a Pap test, a regular exam is a good idea to make sure no other problems are starting.   - If you are between ages 36 and 66 years, and you have had normal Pap tests going back 10 years, you no longer need Pap tests. Even if you no longer need a Pap test, a regular exam is a good idea to make sure no other problems are starting.   - If you have had past treatment for cervical cancer or a condition that could lead to cancer, you need Pap tests and screening for cancer for at least 20 years after your treatment.   - If Pap tests have been discontinued, risk factors (such as a new sexual partner) need to be reassessed to determine if screening should  be resumed.   - The HPV test is an additional test that may be used for cervical cancer screening. The HPV test looks for the virus that can cause the cell changes on the cervix. The cells collected during the Pap test can be tested for HPV. The HPV test could be used to screen women aged 70 years and older, and should be used in women of any age who have unclear Pap test results. After the age of 67, women should have HPV testing at the same frequency as a Pap test.   Colorectal cancer can be detected and often prevented. Most routine colorectal cancer screening begins at the age of 57 years and continues through age 26 years. However, your health care provider may recommend screening at an earlier age if you have risk factors for colon cancer. On a yearly basis, your health care provider may provide home test kits to check for hidden blood in the stool.  Use of a small camera at the end of a tube, to directly examine the colon (sigmoidoscopy or colonoscopy), can detect the earliest forms of colorectal cancer. Talk to your health care provider about this at age 23, when routine screening begins. Direct exam of the colon should be repeated every 5 -10 years through age 49 years, unless early forms of pre-cancerous polyps or small growths are found.   People who are at an increased risk for hepatitis B should be screened for this virus. You are considered at high risk for hepatitis B if:  -You were born in a country where hepatitis B occurs often. Talk with your health care provider about which countries are considered high risk.  - Your parents were born in a high-risk country and you have not received a shot to protect against hepatitis B (hepatitis B vaccine).  - You have HIV or AIDS.  - You use needles to inject street drugs.  - You live with, or have sex with, someone who has Hepatitis B.  - You get hemodialysis treatment.  - You take certain medicines for conditions like cancer, organ  transplantation, and autoimmune conditions.   Hepatitis C blood testing is recommended for all people born from [birth year] through 1965 and any individual  with known risks for hepatitis C.   Practice safe sex. Use condoms and avoid high-risk sexual practices to reduce the spread of sexually transmitted infections (STIs). STIs include gonorrhea, chlamydia, syphilis, trichomonas, herpes, HPV, and human immunodeficiency virus (HIV). Herpes, HIV, and HPV are viral illnesses that have no cure. They can result in disability, cancer, and death. Sexually active women aged 25 years and younger should be checked for chlamydia. Older women with new or multiple partners should also be tested for chlamydia. Testing for other STIs is recommended if you are sexually active and at increased risk.   Osteoporosis is a disease in which the bones lose minerals and strength with aging. This can result in serious bone fractures or breaks. The risk of osteoporosis can be identified using a bone density scan. Women ages 65 years and over and women at risk for fractures or osteoporosis should discuss screening with their health care providers. Ask your health care provider whether you should take a calcium supplement or vitamin D to There are also several preventive steps women can take to avoid osteoporosis and resulting fractures or to keep osteoporosis from worsening. -->Recommendations include:  Eat a balanced diet high in fruits, vegetables, calcium, and vitamins.  Get enough calcium. The recommended total intake of is 1,200 mg daily; for best absorption, if taking supplements, divide doses into 250-500 mg doses throughout the day. Of the two types of calcium, calcium carbonate is best absorbed when taken with food but calcium citrate can be taken on an empty stomach.  Get enough vitamin D. NAMS and the National Osteoporosis Foundation recommend at least 1,000 IU per day for women age 50 and over who are at risk of vitamin D  deficiency. Vitamin D deficiency can be caused by inadequate sun exposure (for example, those who live in northern latitudes).  Avoid alcohol and smoking. Heavy alcohol intake (more than 7 drinks per week) increases the risk of falls and hip fracture and women smokers tend to lose bone more rapidly and have lower bone mass than nonsmokers. Stopping smoking is one of the most important changes women can make to improve their health and decrease risk for disease.  Be physically active every day. Weight-bearing exercise (for example, fast walking, hiking, jogging, and weight training) may strengthen bones or slow the rate of bone loss that comes with aging. Balancing and muscle-strengthening exercises can reduce the risk of falling and fracture.  Consider therapeutic medications. Currently, several types of effective drugs are available. Healthcare providers can recommend the type most appropriate for each woman.  Eliminate environmental factors that may contribute to accidents. Falls cause nearly 90% of all osteoporotic fractures, so reducing this risk is an important bone-health strategy. Measures include ample lighting, removing obstructions to walking, using nonskid rugs on floors, and placing mats and/or grab bars in showers.  Be aware of medication side effects. Some common medicines make bones weaker. These include a type of steroid drug called glucocorticoids used for arthritis and asthma, some antiseizure drugs, certain sleeping pills, treatments for endometriosis, and some cancer drugs. An overactive thyroid gland or using too much thyroid hormone for an underactive thyroid can also be a problem. If you are taking these medicines, talk to your doctor about what you can do to help protect your bones.reduce the rate of osteoporosis.    Menopause can be associated with physical symptoms and risks. Hormone replacement therapy is available to decrease symptoms and risks. You should talk to your  health care provider   about whether hormone replacement therapy is right for you.   Use sunscreen. Apply sunscreen liberally and repeatedly throughout the day. You should seek shade when your shadow is shorter than you. Protect yourself by wearing long sleeves, pants, a wide-brimmed hat, and sunglasses year round, whenever you are outdoors.   Once a month, do a whole body skin exam, using a mirror to look at the skin on your back. Tell your health care provider of new moles, moles that have irregular borders, moles that are larger than a pencil eraser, or moles that have changed in shape or color.   -Stay current with required vaccines (immunizations).   Influenza vaccine. All adults should be immunized every year.  Tetanus, diphtheria, and acellular pertussis (Td, Tdap) vaccine. Pregnant women should receive 1 dose of Tdap vaccine during each pregnancy. The dose should be obtained regardless of the length of time since the last dose. Immunization is preferred during the 27th 36th week of gestation. An adult who has not previously received Tdap or who does not know her vaccine status should receive 1 dose of Tdap. This initial dose should be followed by tetanus and diphtheria toxoids (Td) booster doses every 10 years. Adults with an unknown or incomplete history of completing a 3-dose immunization series with Td-containing vaccines should begin or complete a primary immunization series including a Tdap dose. Adults should receive a Td booster every 10 years.  Varicella vaccine. An adult without evidence of immunity to varicella should receive 2 doses or a second dose if she has previously received 1 dose. Pregnant females who do not have evidence of immunity should receive the first dose after pregnancy. This first dose should be obtained before leaving the health care facility. The second dose should be obtained 4 8 weeks after the first dose.  Human papillomavirus (HPV) vaccine. Females aged 13 26  years who have not received the vaccine previously should obtain the 3-dose series. The vaccine is not recommended for use in pregnant females. However, pregnancy testing is not needed before receiving a dose. If a female is found to be pregnant after receiving a dose, no treatment is needed. In that case, the remaining doses should be delayed until after the pregnancy. Immunization is recommended for any person with an immunocompromised condition through the age of 26 years if she did not get any or all doses earlier. During the 3-dose series, the second dose should be obtained 4 8 weeks after the first dose. The third dose should be obtained 24 weeks after the first dose and 16 weeks after the second dose.  Zoster vaccine. One dose is recommended for adults aged 60 years or older unless certain conditions are present.  Measles, mumps, and rubella (MMR) vaccine. Adults born before 1957 generally are considered immune to measles and mumps. Adults born in 1957 or later should have 1 or more doses of MMR vaccine unless there is a contraindication to the vaccine or there is laboratory evidence of immunity to each of the three diseases. A routine second dose of MMR vaccine should be obtained at least 28 days after the first dose for students attending postsecondary schools, health care workers, or international travelers. People who received inactivated measles vaccine or an unknown type of measles vaccine during 1963 1967 should receive 2 doses of MMR vaccine. People who received inactivated mumps vaccine or an unknown type of mumps vaccine before 1979 and are at high risk for mumps infection should consider immunization with 2 doses of   MMR vaccine. For females of childbearing age, rubella immunity should be determined. If there is no evidence of immunity, females who are not pregnant should be vaccinated. If there is no evidence of immunity, females who are pregnant should delay immunization until after pregnancy.  Unvaccinated health care workers born before [birth year] who lack laboratory evidence of measles, mumps, or rubella immunity or laboratory confirmation of disease should consider measles and mumps immunization with 2 doses of MMR vaccine or rubella immunization with 1 dose of MMR vaccine.  Pneumococcal 13-valent conjugate (PCV13) vaccine. When indicated, a person who is uncertain of her immunization history and has no record of immunization should receive the PCV13 vaccine. An adult aged 54 years or older who has certain medical conditions and has not been previously immunized should receive 1 dose of PCV13 vaccine. This PCV13 should be followed with a dose of pneumococcal polysaccharide (PPSV23) vaccine. The PPSV23 vaccine dose should be obtained at least 8 weeks after the dose of PCV13 vaccine. An adult aged 58 years or older who has certain medical conditions and previously received 1 or more doses of PPSV23 vaccine should receive 1 dose of PCV13. The PCV13 vaccine dose should be obtained 1 or more years after the last PPSV23 vaccine dose.  Pneumococcal polysaccharide (PPSV23) vaccine. When PCV13 is also indicated, PCV13 should be obtained first. All adults aged 58 years and older should be immunized. An adult younger than age 65 years who has certain medical conditions should be immunized. Any person who resides in a nursing home or long-term care facility should be immunized. An adult smoker should be immunized. People with an immunocompromised condition and certain other conditions should receive both PCV13 and PPSV23 vaccines. People with human immunodeficiency virus (HIV) infection should be immunized as soon as possible after diagnosis. Immunization during chemotherapy or radiation therapy should be avoided. Routine use of PPSV23 vaccine is not recommended for American Indians, [place] Natives, or people younger than 65 years unless there are medical conditions that require PPSV23 vaccine. When indicated,  people who have unknown immunization and have no record of immunization should receive PPSV23 vaccine. One-time revaccination 5 years after the first dose of PPSV23 is recommended for people aged 70 64 years who have chronic kidney failure, nephrotic syndrome, asplenia, or immunocompromised conditions. People who received 1 2 doses of PPSV23 before age 32 years should receive another dose of PPSV23 vaccine at age 96 years or later if at least 5 years have passed since the previous dose. Doses of PPSV23 are not needed for people immunized with PPSV23 at or after age 55 years.  Meningococcal vaccine. Adults with asplenia or persistent complement component deficiencies should receive 2 doses of quadrivalent meningococcal conjugate (MenACWY-D) vaccine. The doses should be obtained at least 2 months apart. Microbiologists working with certain meningococcal bacteria, [employer] recruits, people at risk during an outbreak, and people who travel to or live in countries with a high rate of meningitis should be immunized. A first-year college student up through age 58 years who is living in a residence hall should receive a dose if she did not receive a dose on or after her 16th birthday. Adults who have certain high-risk conditions should receive one or more doses of vaccine.  Hepatitis A vaccine. Adults who wish to be protected from this disease, have certain high-risk conditions, work with hepatitis A-infected animals, work in hepatitis A research labs, or travel to or work in countries with a high rate of hepatitis A should be  immunized. Adults who were previously unvaccinated and who anticipate close contact with an international adoptee during the first 60 days after arrival in the [place] from a country with a high rate of hepatitis A should be immunized.  Hepatitis B vaccine.  Adults who wish to be protected from this disease, have certain high-risk conditions, may be exposed to blood or other infectious  body fluids, are household contacts or sex partners of hepatitis B positive people, are clients or workers in certain care facilities, or travel to or work in countries with a high rate of hepatitis B should be immunized.  Haemophilus influenzae type b (Hib) vaccine. A previously unvaccinated person with asplenia or sickle cell disease or having a scheduled splenectomy should receive 1 dose of Hib vaccine. Regardless of previous immunization, a recipient of a hematopoietic stem cell transplant should receive a 3-dose series 6 12 months after her successful transplant. Hib vaccine is not recommended for adults with HIV infection.  Preventive Services / Frequency Ages 6 to 39years  Blood pressure check.** / Every 1 to 2 years.  Lipid and cholesterol check.** / Every 5 years beginning at age 39.  Clinical breast exam.** / Every 3 years for women in their 61s and 62s.  BRCA-related cancer risk assessment.** / For women who have family members with a BRCA-related cancer (breast, ovarian, tubal, or peritoneal cancers).  Pap test.** / Every 2 years from ages 47 through 85. Every 3 years starting at age 34 through age 12 or 74 with a history of 3 consecutive normal Pap tests.  HPV screening.** / Every 3 years from ages 46 through ages 43 to 54 with a history of 3 consecutive normal Pap tests.  Hepatitis C blood test.** / For any individual with known risks for hepatitis C.  Skin self-exam. / Monthly.  Influenza vaccine. / Every year.  Tetanus, diphtheria, and acellular pertussis (Tdap, Td) vaccine.** / Consult your health care provider. Pregnant women should receive 1 dose of Tdap vaccine during each pregnancy. 1 dose of Td every 10 years.  Varicella vaccine.** / Consult your health care provider. Pregnant females who do not have evidence of immunity should receive the first dose after pregnancy.  HPV vaccine. / 3 doses over 6 months, if 64 and younger. The vaccine is not recommended for use in  pregnant females. However, pregnancy testing is not needed before receiving a dose.  Measles, mumps, rubella (MMR) vaccine.** / You need at least 1 dose of MMR if you were born in 1957 or later. You may also need a 2nd dose. For females of childbearing age, rubella immunity should be determined. If there is no evidence of immunity, females who are not pregnant should be vaccinated. If there is no evidence of immunity, females who are pregnant should delay immunization until after pregnancy.  Pneumococcal 13-valent conjugate (PCV13) vaccine.** / Consult your health care provider.  Pneumococcal polysaccharide (PPSV23) vaccine.** / 1 to 2 doses if you smoke cigarettes or if you have certain conditions.  Meningococcal vaccine.** / 1 dose if you are age 71 to 37 years and a Market researcher living in a residence hall, or have one of several medical conditions, you need to get vaccinated against meningococcal disease. You may also need additional booster doses.  Hepatitis A vaccine.** / Consult your health care provider.  Hepatitis B vaccine.** / Consult your health care provider.  Haemophilus influenzae type b (Hib) vaccine.** / Consult your health care provider.  Ages 55 to 64years  Blood pressure check.** / Every 1 to 2 years.  Lipid and cholesterol check.** / Every 5 years beginning at age 20 years.  Lung cancer screening. / Every year if you are aged 55 80 years and have a 30-pack-year history of smoking and currently smoke or have quit within the past 15 years. Yearly screening is stopped once you have quit smoking for at least 15 years or develop a health problem that would prevent you from having lung cancer treatment.  Clinical breast exam.** / Every year after age 40 years.  BRCA-related cancer risk assessment.** / For women who have family members with a BRCA-related cancer (breast, ovarian, tubal, or peritoneal cancers).  Mammogram.** / Every year beginning at age 40  years and continuing for as long as you are in good health. Consult with your health care provider.  Pap test.** / Every 3 years starting at age 30 years through age 65 or 70 years with a history of 3 consecutive normal Pap tests.  HPV screening.** / Every 3 years from ages 30 years through ages 65 to 70 years with a history of 3 consecutive normal Pap tests.  Fecal occult blood test (FOBT) of stool. / Every year beginning at age 50 years and continuing until age 75 years. You may not need to do this test if you get a colonoscopy every 10 years.  Flexible sigmoidoscopy or colonoscopy.** / Every 5 years for a flexible sigmoidoscopy or every 10 years for a colonoscopy beginning at age 50 years and continuing until age 75 years.  Hepatitis C blood test.** / For all people born from 1945 through 1965 and any individual with known risks for hepatitis C.  Skin self-exam. / Monthly.  Influenza vaccine. / Every year.  Tetanus, diphtheria, and acellular pertussis (Tdap/Td) vaccine.** / Consult your health care provider. Pregnant women should receive 1 dose of Tdap vaccine during each pregnancy. 1 dose of Td every 10 years.  Varicella vaccine.** / Consult your health care provider. Pregnant females who do not have evidence of immunity should receive the first dose after pregnancy.  Zoster vaccine.** / 1 dose for adults aged 60 years or older.  Measles, mumps, rubella (MMR) vaccine.** / You need at least 1 dose of MMR if you were born in 1957 or later. You may also need a 2nd dose. For females of childbearing age, rubella immunity should be determined. If there is no evidence of immunity, females who are not pregnant should be vaccinated. If there is no evidence of immunity, females who are pregnant should delay immunization until after pregnancy.  Pneumococcal 13-valent conjugate (PCV13) vaccine.** / Consult your health care provider.  Pneumococcal polysaccharide (PPSV23) vaccine.** / 1 to 2 doses if  you smoke cigarettes or if you have certain conditions.  Meningococcal vaccine.** / Consult your health care provider.  Hepatitis A vaccine.** / Consult your health care provider.  Hepatitis B vaccine.** / Consult your health care provider.  Haemophilus influenzae type b (Hib) vaccine.** / Consult your health care provider.  Ages 65 years and over  Blood pressure check.** / Every 1 to 2 years.  Lipid and cholesterol check.** / Every 5 years beginning at age 20 years.  Lung cancer screening. / Every year if you are aged 55 80 years and have a 30-pack-year history of smoking and currently smoke or have quit within the past 15 years. Yearly screening is stopped once you have quit smoking for at least 15 years or develop a health problem that   would prevent you from having lung cancer treatment.  Clinical breast exam.** / Every year after age 103 years.  BRCA-related cancer risk assessment.** / For women who have family members with a BRCA-related cancer (breast, ovarian, tubal, or peritoneal cancers).  Mammogram.** / Every year beginning at age 36 years and continuing for as long as you are in good health. Consult with your health care provider.  Pap test.** / Every 3 years starting at age 5 years through age 85 or 10 years with 3 consecutive normal Pap tests. Testing can be stopped between 65 and 70 years with 3 consecutive normal Pap tests and no abnormal Pap or HPV tests in the past 10 years.  HPV screening.** / Every 3 years from ages 93 years through ages 70 or 45 years with a history of 3 consecutive normal Pap tests. Testing can be stopped between 65 and 70 years with 3 consecutive normal Pap tests and no abnormal Pap or HPV tests in the past 10 years.  Fecal occult blood test (FOBT) of stool. / Every year beginning at age 8 years and continuing until age 45 years. You may not need to do this test if you get a colonoscopy every 10 years.  Flexible sigmoidoscopy or colonoscopy.** /  Every 5 years for a flexible sigmoidoscopy or every 10 years for a colonoscopy beginning at age 69 years and continuing until age 68 years.  Hepatitis C blood test.** / For all people born from [birth year] through 1965 and any individual with known risks for hepatitis C.  Osteoporosis screening.** / A one-time screening for women ages 7 years and over and women at risk for fractures or osteoporosis.  Skin self-exam. / Monthly.  Influenza vaccine. / Every year.  Tetanus, diphtheria, and acellular pertussis (Tdap/Td) vaccine.** / 1 dose of Td every 10 years.  Varicella vaccine.** / Consult your health care provider.  Zoster vaccine.** / 1 dose for adults aged 5 years or older.  Pneumococcal 13-valent conjugate (PCV13) vaccine.** / Consult your health care provider.  Pneumococcal polysaccharide (PPSV23) vaccine.** / 1 dose for all adults aged 74 years and older.  Meningococcal vaccine.** / Consult your health care provider.  Hepatitis A vaccine.** / Consult your health care provider.  Hepatitis B vaccine.** / Consult your health care provider.  Haemophilus influenzae type b (Hib) vaccine.** / Consult your health care provider. ** Family history and personal history of risk and conditions may change your health care provider's recommendations. Document Released: 05/25/2001 Document Revised: 01/17/2013  [facility] Patient Information 2014 [place], [place].   EXERCISE AND DIET:  We recommended that you start or continue a regular exercise program for good health. Regular exercise means any activity that makes your heart beat faster and makes you sweat.  We recommend exercising at least 30 minutes per day at least 3 days a week, preferably 5.  We also recommend a diet low in fat and sugar / carbohydrates.  Inactivity, poor dietary choices and obesity can cause diabetes, heart attack, stroke, and kidney damage, among others.     ALCOHOL AND SMOKING:  Women should limit their alcohol intake to no  more than 7 drinks/beers/glasses of wine (combined, not each!) per week. Moderation of alcohol intake to this level decreases your risk of breast cancer and liver damage.  ( And of course, no recreational drugs are part of a healthy lifestyle.)  Also, you should not be smoking at all or even being exposed to second hand smoke. Most people know smoking can  cause cancer, and various heart and lung diseases, but did you know it also contributes to weakening of your bones?  Aging of your skin?  Yellowing of your teeth and nails?   CALCIUM AND VITAMIN D:  Adequate intake of calcium and Vitamin D are recommended.  The recommendations for exact amounts of these supplements seem to change often, but generally speaking 600 mg of calcium (either carbonate or citrate) and 800 units of Vitamin D per day seems prudent. Certain women may benefit from higher intake of Vitamin D.  If you are among these women, your doctor will have told you during your visit.     PAP SMEARS:  Pap smears, to check for cervical cancer or precancers,  have traditionally been done yearly, although recent scientific advances have shown that most women can have pap smears less often.  However, every woman still should have a physical exam from her gynecologist or primary care physician every year. It will include a breast check, inspection of the vulva and vagina to check for abnormal growths or skin changes, a visual exam of the cervix, and then an exam to evaluate the size and shape of the uterus and ovaries.  And after 62 years of age, a rectal exam is indicated to check for rectal cancers. We will also provide age appropriate advice regarding health maintenance, like when you should have certain vaccines, screening for sexually transmitted diseases, bone density testing, colonoscopy, mammograms, etc.    MAMMOGRAMS:  All women over 50 years old should have a yearly mammogram. Many facilities now offer a "3D" mammogram, which may cost  around $50 extra out of pocket. If possible,  we recommend you accept the option to have the 3D mammogram performed.  It both reduces the number of women who will be called back for extra views which then turn out to be normal, and it is better than the routine mammogram at detecting truly abnormal areas.     COLONOSCOPY:  Colonoscopy to screen for colon cancer is recommended for all women at age 74.  We know, you hate the idea of the prep.  We agree, BUT, having colon cancer and not knowing it is worse!!  Colon cancer so often starts as a polyp that can be seen and removed at colonscopy, which can quite literally save your life!  And if your first colonoscopy is normal and you have no family history of colon cancer, most women don't have to have it again for 10 years.  Once every ten years, you can do something that may end up saving your life, right?  We will be happy to help you get it scheduled when you are ready.  Be sure to check your insurance coverage so you understand how much it will cost.  It may be covered as a preventative service at no cost, but you should check your particular policy.    Please schedule fasting lab appt at your convenience. Increase water intake, strive for at least 80 ounces/day.   Follow Heart Healthy diet Increase regular exercise.  Recommend at least 30 minutes daily, 5 days per week of walking, jogging, biking, swimming, YouTube/Pinterest workout videos. Recommend annual follow-up with fasting labs. NICE TO SEE YOU!

## 2017-08-02 NOTE — Assessment & Plan Note (Signed)
Please schedule fasting lab appt at your convenience. Increase water intake, strive for at least 80 ounces/day.  Follow Heart Healthy diet Increase regular exercise.  Recommend at least 30 minutes daily, 5 days per week of walking, jogging, biking, swimming, YouTube/Pinterest workout videos. Recommend annual follow-up with fasting labs.

## 2017-08-02 NOTE — Assessment & Plan Note (Signed)
Declined colonoscopy, ordered Cologuard

## 2017-08-02 NOTE — Assessment & Plan Note (Signed)
Mammogram ordered

## 2017-08-04 LAB — CYTOLOGY - PAP: Diagnosis: NEGATIVE

## 2017-08-18 ENCOUNTER — Other Ambulatory Visit: Payer: 59

## 2017-08-25 ENCOUNTER — Other Ambulatory Visit: Payer: 59

## 2017-08-25 DIAGNOSIS — Z833 Family history of diabetes mellitus: Secondary | ICD-10-CM

## 2017-08-25 DIAGNOSIS — Z83438 Family history of other disorder of lipoprotein metabolism and other lipidemia: Secondary | ICD-10-CM

## 2017-08-25 DIAGNOSIS — R5383 Other fatigue: Secondary | ICD-10-CM

## 2017-08-26 LAB — COMPREHENSIVE METABOLIC PANEL
ALBUMIN: 4.2 g/dL (ref 3.6–4.8)
ALK PHOS: 76 IU/L (ref 39–117)
ALT: 15 IU/L (ref 0–32)
AST: 14 IU/L (ref 0–40)
Albumin/Globulin Ratio: 1.8 (ref 1.2–2.2)
BUN / CREAT RATIO: 16 (ref 12–28)
BUN: 11 mg/dL (ref 8–27)
Bilirubin Total: 0.4 mg/dL (ref 0.0–1.2)
CALCIUM: 8.7 mg/dL (ref 8.7–10.3)
CO2: 22 mmol/L (ref 20–29)
CREATININE: 0.69 mg/dL (ref 0.57–1.00)
Chloride: 101 mmol/L (ref 96–106)
GFR calc Af Amer: 109 mL/min/{1.73_m2} (ref >59)
GFR, EST NON AFRICAN AMERICAN: 94 mL/min/{1.73_m2} (ref >59)
GLOBULIN, TOTAL: 2.3 g/dL (ref 1.5–4.5)
GLUCOSE: 118 mg/dL — AB (ref 65–99)
Potassium: 4.3 mmol/L (ref 3.5–5.2)
SODIUM: 137 mmol/L (ref 134–144)
Total Protein: 6.5 g/dL (ref 6.0–8.5)

## 2017-08-26 LAB — LIPID PANEL
CHOLESTEROL TOTAL: 185 mg/dL (ref 100–199)
Chol/HDL Ratio: 4.6 ratio — ABNORMAL HIGH (ref 0.0–4.4)
HDL: 40 mg/dL (ref >39)
LDL Calculated: 111 mg/dL — ABNORMAL HIGH (ref 0–99)
Triglycerides: 171 mg/dL — ABNORMAL HIGH (ref 0–149)
VLDL CHOLESTEROL CAL: 34 mg/dL (ref 5–40)

## 2017-08-26 LAB — TSH: TSH: 4.46 u[IU]/mL (ref 0.450–4.500)

## 2017-08-26 LAB — CBC WITH DIFFERENTIAL/PLATELET
BASOS: 1 %
Basophils Absolute: 0.1 10*3/uL (ref 0.0–0.2)
EOS (ABSOLUTE): 0.4 10*3/uL (ref 0.0–0.4)
EOS: 5 %
HEMATOCRIT: 40 % (ref 34.0–46.6)
HEMOGLOBIN: 13.4 g/dL (ref 11.1–15.9)
IMMATURE GRANS (ABS): 0 10*3/uL (ref 0.0–0.1)
Immature Granulocytes: 0 %
LYMPHS: 30 %
Lymphocytes Absolute: 2.6 10*3/uL (ref 0.7–3.1)
MCH: 27.6 pg (ref 26.6–33.0)
MCHC: 33.5 g/dL (ref 31.5–35.7)
MCV: 83 fL (ref 79–97)
MONOCYTES: 8 %
Monocytes Absolute: 0.7 10*3/uL (ref 0.1–0.9)
NEUTROS ABS: 4.8 10*3/uL (ref 1.4–7.0)
NEUTROS PCT: 56 %
PLATELETS: 223 10*3/uL (ref 150–379)
RBC: 4.85 x10E6/uL (ref 3.77–5.28)
RDW: 13.5 % (ref 12.3–15.4)
WBC: 8.6 10*3/uL (ref 3.4–10.8)

## 2017-08-26 LAB — HEMOGLOBIN A1C
ESTIMATED AVERAGE GLUCOSE: 134 mg/dL
HEMOGLOBIN A1C: 6.3 % — AB (ref 4.8–5.6)

## 2017-08-26 LAB — VITAMIN B12: Vitamin B-12: 433 pg/mL (ref 232–1245)

## 2017-08-26 LAB — VITAMIN D 25 HYDROXY (VIT D DEFICIENCY, FRACTURES): Vit D, 25-Hydroxy: 31.7 ng/mL (ref 30.0–100.0)

## 2017-09-06 DIAGNOSIS — S61214A Laceration without foreign body of right ring finger without damage to nail, initial encounter: Secondary | ICD-10-CM | POA: Insufficient documentation

## 2018-04-17 ENCOUNTER — Encounter: Payer: Self-pay | Admitting: Adult Health

## 2018-04-17 ENCOUNTER — Ambulatory Visit (INDEPENDENT_AMBULATORY_CARE_PROVIDER_SITE_OTHER): Payer: 59 | Admitting: Adult Health

## 2018-04-17 VITALS — BP 158/77 | HR 67 | Temp 98.6°F | Ht 62.0 in | Wt 189.4 lb

## 2018-04-17 DIAGNOSIS — M545 Low back pain, unspecified: Secondary | ICD-10-CM

## 2018-04-17 MED ORDER — HYDROCODONE-ACETAMINOPHEN 5-325 MG PO TABS: 1.0000 | ORAL_TABLET | Freq: Four times a day (QID) | ORAL | 0 refills | Status: AC | PRN

## 2018-04-17 MED ORDER — CYCLOBENZAPRINE HCL 10 MG PO TABS: 10.0000 mg | ORAL_TABLET | Freq: Three times a day (TID) | ORAL | 0 refills | Status: DC | PRN

## 2018-04-17 NOTE — Assessment & Plan Note (Signed)
[place] Controlled Substance Database reviewed- no aberrancies noted Please use Aleve for mild-moderate pain and vicodin for severe pain. Please use cyclobenzaprine as needed for muscle spasm. Please use care instructions as listed above. Work Engineer, production provided, out today/tomorrow- okay to return Wed 04/19/2018 If symptoms do not improve in few weeks, please call clinic. To improve overall back health- stretch daily, reduce weight, and keep core/abdominal muscles strong.

## 2018-04-17 NOTE — Progress Notes (Signed)
Subjective:    Patient ID: Michelle Wheeler, female    DOB: 12-19-1955, 63 y.o.   MRN: [id number]  HPI :  Michelle Wheeler presents with acute L lumbar back pain that started 4 days ago. 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 She denies acute injury/trauma, reports just "over using my back for days" She denies change in bowel/bladder habits She denies hematuria/hematochezia She denies numbness/tingling in lower extremities She has been using heating pad, rest, and OTC NSAIDS- last dose of Aleve was yesterday. She reports localized L lumbar back pain without radiation/sciatica She reports constant, throbbing pain that is rated 10/10 She denies previous back injury or acute pain issues   Patient Care Team    Relationship Specialty Notifications Start End  William Hamburger D, NP PCP - General Family Medicine  09/14/16   Harrington Challenger, NP Nurse Practitioner Obstetrics and Gynecology  08/02/17     Patient Active Problem List   Diagnosis Date Noted  . Acute left-sided low back pain without sciatica 04/17/2018  . Screening for colon cancer 08/02/2017  . Screening for cervical cancer 08/02/2017  . Healthcare maintenance 08/02/2017  . Chest wall muscle strain 07/07/2017  . Obesity (BMI 30.0-34.9) 09/14/2016  . Family history of hyperlipidemia 09/14/2016  . Other fatigue 09/14/2016  . Family history of diabetes mellitus in father 09/14/2016  . Screening for breast cancer 09/14/2016     History reviewed. No pertinent past medical history.   History reviewed. No pertinent surgical history.   Family History  Problem Relation Age of Onset  . Diabetes Father   . Cancer Father        kidney  . Hyperlipidemia Father   . Hypertension Father   . Diabetes Paternal Uncle   . Hypertension Paternal Uncle   . Diabetes Mother   . Diabetes Paternal Aunt   .  Diabetes Paternal Uncle   . Hypertension Paternal Uncle   . Diabetes Paternal Uncle   . Hypertension Paternal Uncle      Social History   Substance and Sexual Activity  Drug Use No     Social History   Substance and Sexual Activity  Alcohol Use No     Social History   Tobacco Use  Smoking Status Never Smoker  Smokeless Tobacco Never Used     Outpatient Encounter Medications as of 04/17/2018  Medication Sig  . Cholecalciferol (VITAMIN D3) 2000 units TABS Take 1 tablet by mouth daily.  . Multiple Vitamin (MULTIVITAMIN) tablet Take 1 tablet by mouth daily.  . cyclobenzaprine (FLEXERIL) 10 MG tablet Take 1 tablet (10 mg total) by mouth 3 (three) times daily as needed for muscle spasms.  Marland Kitchen HYDROcodone-acetaminophen (NORCO/VICODIN) 5-325 MG tablet Take 1 tablet by mouth every 6 (six) hours as needed for up to 3 days for moderate pain.  . [DISCONTINUED] cyclobenzaprine (FLEXERIL) 10 MG tablet Take 1 tablet (10 mg total) by mouth 3 (three) times daily as needed for muscle spasms.  . [DISCONTINUED] norethindrone-ethinyl estradiol (JINTELI) 1-5 MG-MCG TABS tablet    No facility-administered encounter medications on file as of 04/17/2018.     Allergies: Codeine  Body mass index is 34.64 kg/m.  Blood pressure (!) 158/77, pulse 67, temperature 98.6 F (37 C), temperature source Oral, height 5\' 2"  (1.575 m), weight 189 lb 6.4 oz (85.9 kg), last menstrual period 04/09/2010, SpO2 96 %.  Review of Systems  Constitutional: Positive for activity change and fatigue. Negative for appetite change, chills, diaphoresis, fever and unexpected weight change.  Respiratory: Negative for cough, chest tightness, shortness of breath, wheezing and stridor.   Cardiovascular: Negative for chest pain, palpitations and leg swelling.  Gastrointestinal: Negative for abdominal distention, abdominal pain, blood in stool, constipation, diarrhea, nausea and vomiting.  Endocrine: Negative for cold  intolerance, heat intolerance, polydipsia, polyphagia and polyuria.  Genitourinary: Negative for difficulty urinating, dysuria and flank pain.  Musculoskeletal: Positive for arthralgias, back pain, gait problem and myalgias. Negative for joint swelling, neck pain and neck stiffness.  Neurological: Negative for dizziness.  Hematological: Does not bruise/bleed easily.  Psychiatric/Behavioral: Positive for sleep disturbance.       Objective:   Physical Exam Vitals signs and nursing note reviewed.  Constitutional:      General: She is not in acute distress.    Appearance: She is not ill-appearing, toxic-appearing or diaphoretic.  HENT:     Head: Normocephalic and atraumatic.  Eyes:     Extraocular Movements: Extraocular movements intact.     Conjunctiva/sclera: Conjunctivae normal.     Pupils: Pupils are equal, round, and reactive to light.  Cardiovascular:     Rate and Rhythm: Normal rate.     Pulses: Normal pulses.     Heart sounds: Normal heart sounds. No murmur. No friction rub. No gallop.   Pulmonary:     Effort: Pulmonary effort is normal. No respiratory distress.     Breath sounds: Normal breath sounds. No stridor. No wheezing, rhonchi or rales.  Chest:     Chest wall: No tenderness.  Abdominal:     General: Bowel sounds are normal. There is no distension.     Palpations: There is no mass.     Tenderness: There is no abdominal tenderness. There is no right CVA tenderness, left CVA tenderness, guarding or rebound.     Hernia: No hernia is present.  Musculoskeletal:        General: No deformity.     Cervical back: Normal.     Thoracic back: Normal.     Lumbar back: She exhibits tenderness and spasm. She exhibits normal range of motion, no bony tenderness and no swelling.     Comments: Neg bil leg raises   Skin:    General: Skin is warm and dry.     Capillary Refill: Capillary refill takes less than 2 seconds.  Neurological:     Mental Status: She is alert and oriented to  person, place, and time.  Psychiatric:        Mood and Affect: Mood normal.        Behavior: Behavior normal.        Thought Content: Thought content normal.        Judgment: Judgment normal.       Assessment & Plan:   1. Acute left-sided low back pain without sciatica     Acute left-sided low back pain without sciatica [place] Controlled Substance Database reviewed- no aberrancies noted Please use Aleve for mild-moderate pain and vicodin for severe pain. Please use cyclobenzaprine as needed for muscle spasm. Please use care instructions as listed above. Work Engineer, productionxcuse provided, out today/tomorrow- okay to return Wed 04/19/2018 If symptoms do not improve in few weeks, please call clinic. To improve overall back health- stretch daily, reduce weight, and keep core/abdominal muscles strong.    FOLLOW-UP:  Return if symptoms worsen or fail to improve.

## 2018-04-17 NOTE — Patient Instructions (Addendum)
Acute Back Pain, Adult Acute back pain is sudden and usually short-lived. It is often caused by an injury to the muscles and tissues in the back. The injury may result from:  A muscle or ligament getting overstretched or torn (strained). Ligaments are tissues that connect bones to each other. Lifting something improperly can cause a back strain.  Wear and tear (degeneration) of the spinal disks. Spinal disks are circular tissue that provides cushioning between the bones of the spine (vertebrae).  Twisting motions, such as while playing sports or doing yard work.  A hit to the back.  Arthritis. You may have a physical exam, lab tests, and imaging tests to find the cause of your pain. Acute back pain usually goes away with rest and home care. Follow these instructions at home: Managing pain, stiffness, and swelling  Take over-the-counter and prescription medicines only as told by your health care provider.  Your health care provider may recommend applying ice during the first 24-48 hours after your pain starts. To do this: ? Put ice in a plastic bag. ? Place a towel between your skin and the bag. ? Leave the ice on for 20 minutes, 2-3 times a day.  If directed, apply heat to the affected area as often as told by your health care provider. Use the heat source that your health care provider recommends, such as a moist heat pack or a heating pad. ? Place a towel between your skin and the heat source. ? Leave the heat on for 20-30 minutes. ? Remove the heat if your skin turns bright red. This is especially important if you are unable to feel pain, heat, or cold. You have a greater risk of getting burned. Activity   Do not stay in bed. Staying in bed for more than 1-2 days can delay your recovery.  Sit up and stand up straight. Avoid leaning forward when you sit, or hunching over when you stand. ? If you work at a desk, sit close to it so you do not need to lean over. Keep your chin tucked  in. Keep your neck drawn back, and keep your elbows bent at a right angle. Your arms should look like the letter "L." ? Sit high and close to the steering wheel when you drive. Add lower back (lumbar) support to your car seat, if needed.  Take short walks on even surfaces as soon as you are able. Try to increase the length of time you walk each day.  Do not sit, drive, or stand in one place for more than 30 minutes at a time. Sitting or standing for long periods of time can put stress on your back.  Do not drive or use heavy machinery while taking prescription pain medicine.  Use proper lifting techniques. When you bend and lift, use positions that put less stress on your back: ? Bend your knees. ? Keep the load close to your body. ? Avoid twisting.  Exercise regularly as told by your health care provider. Exercising helps your back heal faster and helps prevent back injuries by keeping muscles strong and flexible.  Work with a physical therapist to make a safe exercise program, as recommended by your health care provider. Do any exercises as told by your physical therapist. Lifestyle  Maintain a healthy weight. Extra weight puts stress on your back and makes it difficult to have good posture.  Avoid activities or situations that make you feel anxious or stressed. Stress and anxiety increase muscle   tension and can make back pain worse. Learn ways to manage anxiety and stress, such as through exercise. General instructions  Sleep on a firm mattress in a comfortable position. Try lying on your side with your knees slightly bent. If you lie on your back, put a pillow under your knees.  Follow your treatment plan as told by your health care provider. This may include: ? Cognitive or behavioral therapy. ? Acupuncture or massage therapy. ? Meditation or yoga. Contact a health care provider if:  You have pain that is not relieved with rest or medicine.  You have increasing pain going down  into your legs or buttocks.  Your pain does not improve after 2 weeks.  You have pain at night.  You lose weight without trying.  You have a fever or chills. Get help right away if:  You develop new bowel or bladder control problems.  You have unusual weakness or numbness in your arms or legs.  You develop nausea or vomiting.  You develop abdominal pain.  You feel faint. Summary  Acute back pain is sudden and usually short-lived.  Use proper lifting techniques. When you bend and lift, use positions that put less stress on your back.  Take over-the-counter and prescription medicines and apply heat or ice as directed by your health care provider. This information is not intended to replace advice given to you by your health care provider. Make sure you discuss any questions you have with your health care provider. Document Released: 03/29/2005 Document Revised: 11/03/2017 Document Reviewed: 11/10/2016 [employer] Interactive Patient Education  2019 [employer]   Please use Aleve for mild-moderate pain and vicodin for severe pain. Please use cyclobenzaprine as needed for muscle spasm. Please use care instructions as listed above. Work Engineer, production provided, out today/tomorrow- okay to return Wed 04/19/2018 If symptoms do not improve in few weeks, please call clinic. To improve overall back health- stretch daily, reduce weight, and keep core/abdominal muscles strong. FEEL BETTER!

## 2018-08-21 ENCOUNTER — Telehealth: Payer: Self-pay | Admitting: Adult Health

## 2018-08-21 NOTE — Telephone Encounter (Signed)
Good Afternoon Melissa, Can you please call Ms. Voight and share- To help with the symptoms: 1) Apply cold compress to lips. 2) Apply aloe vera to areas of burn. 3) OTC Vanicream moisturizer cream or OTC CeraVe Moisturizer cream. 4) OTC Ibuprofen - per manufacturer instructions to help with pain and swelling. 5) Avoid any petroleum based products as these can trap heat from the sun burn. 6) If she has  Dermatologist, would recommend following up with that specialist to ensure the burn heals well and does not develop into a sun cancer. Sincerely, Orpha Bur

## 2018-08-21 NOTE — Telephone Encounter (Signed)
Patient notified. MPulliam, CMA/RT(R)  

## 2018-08-21 NOTE — Telephone Encounter (Signed)
Pt states that she was at the beach last week and got a "sunburn" to her lips, stating that they blistered up and popped.  She states that has a hx of fever blisters, but this is nothing like that.  She has tried vitamin E on her lips as well as chap stick with no relief.  Please advise.  Tiajuana Amass, CMA

## 2018-08-21 NOTE — Telephone Encounter (Signed)
Patient called to request an Rx for severely sunburned lips.---Forwarding request to medical assistant to use : [facility] if there is any prescription lip balm that can be called in.  --glh

## 2018-12-04 ENCOUNTER — Other Ambulatory Visit: Payer: Self-pay | Admitting: Adult Health

## 2018-12-04 DIAGNOSIS — Z1231 Encounter for screening mammogram for malignant neoplasm of breast: Secondary | ICD-10-CM

## 2018-12-05 ENCOUNTER — Telehealth: Payer: Self-pay | Admitting: Adult Health

## 2018-12-05 DIAGNOSIS — Z1211 Encounter for screening for malignant neoplasm of colon: Secondary | ICD-10-CM

## 2018-12-05 NOTE — Telephone Encounter (Signed)
Patient just found out that they are closing her place of employment and only has 2 weeks on insurance left. She is requesting a Cologuard order to be placed ASAP before insurance expires.

## 2018-12-05 NOTE — Addendum Note (Signed)
Addended by: Fonnie Mu on: 12/05/2018 01:28 PM   Modules accepted: Orders

## 2018-12-05 NOTE — Telephone Encounter (Signed)
Order Cologuard and informed pt.  Charyl Bigger, CMA

## 2018-12-06 ENCOUNTER — Ambulatory Visit
Admission: RE | Admit: 2018-12-06 | Discharge: 2018-12-06 | Disposition: A | Discharge status: Home / Self Care | Payer: 59 | Source: Ambulatory Visit | Attending: Adult Health | Admitting: Adult Health

## 2018-12-06 ENCOUNTER — Other Ambulatory Visit: Payer: Self-pay

## 2018-12-06 DIAGNOSIS — Z1231 Encounter for screening mammogram for malignant neoplasm of breast: Secondary | ICD-10-CM

## 2018-12-19 LAB — COLOGUARD: Cologuard: POSITIVE — AB

## 2018-12-26 DIAGNOSIS — R195 Other fecal abnormalities: Secondary | ICD-10-CM

## 2018-12-26 NOTE — Progress Notes (Signed)
Called pt and informed her of positive Cologuard.  Pt expressed understanding and is agreeable to referral to GI.  Referral placed.  Charyl Bigger, CMA

## 2020-07-02 ENCOUNTER — Encounter: Payer: Self-pay | Admitting: Nurse Practitioner

## 2020-07-02 ENCOUNTER — Other Ambulatory Visit: Payer: Self-pay

## 2020-07-02 ENCOUNTER — Ambulatory Visit (INDEPENDENT_AMBULATORY_CARE_PROVIDER_SITE_OTHER): Payer: 59 | Admitting: Nurse Practitioner

## 2020-07-02 VITALS — BP 146/80 | HR 75 | Temp 97.6°F | Ht 61.0 in | Wt 201.8 lb

## 2020-07-02 DIAGNOSIS — B029 Zoster without complications: Secondary | ICD-10-CM | POA: Diagnosis not present

## 2020-07-02 DIAGNOSIS — R21 Rash and other nonspecific skin eruption: Secondary | ICD-10-CM

## 2020-07-02 DIAGNOSIS — R030 Elevated blood-pressure reading, without diagnosis of hypertension: Secondary | ICD-10-CM | POA: Diagnosis not present

## 2020-07-02 DIAGNOSIS — I152 Hypertension secondary to endocrine disorders: Secondary | ICD-10-CM | POA: Insufficient documentation

## 2020-07-02 MED ORDER — METHYLPREDNISOLONE 4 MG PO TBPK: ORAL_TABLET | ORAL | 0 refills | Status: DC

## 2020-07-02 MED ORDER — VALACYCLOVIR HCL 1 G PO TABS: 1000.0000 mg | ORAL_TABLET | Freq: Two times a day (BID) | ORAL | 0 refills | Status: DC

## 2020-07-02 NOTE — Patient Instructions (Signed)
Shingles  Shingles is an infection. It gives you a painful skin rash and blisters that have fluid in them. Shingles is caused by the same germ (virus) that causes chickenpox. Shingles only happens in people who:  Have had chickenpox.  Have been given a shot of medicine (vaccine) to protect against chickenpox. Shingles is rare in this group. The first symptoms of shingles may be itching, tingling, or pain in an area on your skin. A rash will show on your skin a few days or weeks later. The rash is likely to be on one side of your body. The rash usually has a shape like a belt or a band. Over time, the rash turns into fluid-filled blisters. The blisters will break open, change into scabs, and dry up. Medicines may:  Help with pain and itching.  Help you get better sooner.  Help to prevent long-term problems. Follow these instructions at home: Medicines  Take over-the-counter and prescription medicines only as told by your doctor.  Put on an anti-itch cream or numbing cream where you have a rash, blisters, or scabs. Do this as told by your doctor. Helping with itching and discomfort  Put cold, wet cloths (cold compresses) on the area of the rash or blisters as told by your doctor.  Cool baths can help you feel better. Try adding baking soda or dry oatmeal to the water to lessen itching. Do not bathe in hot water.   Blister and rash care  Keep your rash covered with a loose bandage (dressing).  Wear loose clothing that does not rub on your rash.  Keep your rash and blisters clean. To do this, wash the area with mild soap and cool water as told by your doctor.  Check your rash every day for signs of infection. Check for: ? More redness, swelling, or pain. ? Fluid or blood. ? Warmth. ? Pus or a bad smell.  Do not scratch your rash. Do not pick at your blisters. To help you to not scratch: ? Keep your fingernails clean and cut short. ? Wear gloves or mittens when you sleep, if  scratching is a problem. General instructions  Rest as told by your doctor.  Keep all follow-up visits as told by your doctor. This is important.  Wash your hands often with soap and water. If soap and water are not available, use hand sanitizer. Doing this lowers your chance of getting a skin infection caused by germs (bacteria).  Your infection can cause chickenpox in people who have never had chickenpox or never got a shot of chickenpox vaccine. If you have blisters that did not change into scabs yet, try not to touch other people or be around other people, especially: ? Babies. ? Pregnant women. ? Children who have areas of red, itchy, or rough skin (eczema). ? Very old people who have transplants. ? People who have a long-term (chronic) sickness, like cancer or AIDS. Contact a doctor if:  Your pain does not get better with medicine.  Your pain does not get better after the rash heals.  You have any signs of infection in the rash area. These signs include: ? More redness, swelling, or pain around the rash. ? Fluid or blood coming from the rash. ? The rash area feeling warm to the touch. ? Pus or a bad smell coming from the rash. Get help right away if:  The rash is on your face or nose.  You have pain in your face or pain   by your eye.  You lose feeling on one side of your face.  You have trouble seeing.  You have ear pain, or you have ringing in your ear.  You have a loss of taste.  Your condition gets worse. Summary  Shingles gives you a painful skin rash and blisters that have fluid in them.  Shingles is an infection. It is caused by the same germ (virus) that causes chickenpox.  Keep your rash covered with a loose bandage (dressing). Wear loose clothing that does not rub on your rash.  If you have blisters that did not change into scabs yet, try not to touch other people or be around people. This information is not intended to replace advice given to you by  your health care provider. Make sure you discuss any questions you have with your health care provider. Document Revised: 07/21/2018 Document Reviewed: 12/01/2016 Elsevier Patient Education  2021 Elsevier Inc.  

## 2020-07-02 NOTE — Progress Notes (Signed)
Acute Office Visit  Subjective:    Patient ID: Michelle Wheeler, female    DOB: 09-May-1955, 65 y.o.   MRN: [id number]  Chief Complaint  Patient presents with  . Herpes Zoster    HPI Patient is in today for evaluation of possible shingles rash. States that Friday night into Saturday, she noted pain in skin area over right side of the chest. She states that on Sunday, she woke up with blisters over the area where pain was present. She states that she had six fluid filled and painful blisters over the right breast. This has spread into rash under the right arm and around the right upper back. She states that rash is warm to touch and burns. She states that she has not had shingles in the past and has not had vaccination for shingles. She denies fever, headache, chills, or swollen lymph nodes.   History reviewed. No pertinent past medical history.  History reviewed. No pertinent surgical history.  Family History  Problem Relation Age of Onset  . Diabetes Father   . Cancer Father        kidney  . Hyperlipidemia Father   . Hypertension Father   . Diabetes Paternal Uncle   . Hypertension Paternal Uncle   . Diabetes Mother   . Diabetes Paternal Aunt   . Diabetes Paternal Uncle   . Hypertension Paternal Uncle   . Diabetes Paternal Uncle   . Hypertension Paternal Uncle     Social History   Socioeconomic History  . Marital status: Single    Spouse name: Not on file  . Number of children: Not on file  . Years of education: Not on file  . Highest education level: Not on file  Occupational History  . Not on file  Tobacco Use  . Smoking status: Never Smoker  . Smokeless tobacco: Never Used  Substance and Sexual Activity  . Alcohol use: No  . Drug use: No  . Sexual activity: Yes    Partners: Male    Birth control/protection: Other-see comments  Other Topics Concern  . Not on file  Social History Narrative  . Not on file   Social Determinants of Health   Financial Resource  Strain: Not on file  Food Insecurity: Not on file  Transportation Needs: Not on file  Physical Activity: Not on file  Stress: Not on file  Social Connections: Not on file  Intimate Partner Violence: Not on file    Outpatient Medications Prior to Visit  Medication Sig Dispense Refill  . Cholecalciferol (VITAMIN D3) 2000 units TABS Take 1 tablet by mouth daily.    . Multiple Vitamin (MULTIVITAMIN) tablet Take 1 tablet by mouth daily.    . cyclobenzaprine (FLEXERIL) 10 MG tablet Take 1 tablet (10 mg total) by mouth 3 (three) times daily as needed for muscle spasms. (Patient not taking: Reported on 07/02/2020) 30 tablet 0   No facility-administered medications prior to visit.    Allergies  Allergen Reactions  . Codeine Other (See Comments)    hyperactivity  . Morpholine Salicylate Other (See Comments)    Review of Systems  Constitutional: Positive for fatigue. Negative for chills and fever.  HENT: Negative for congestion and sinus pain.   Eyes: Negative.   Respiratory: Negative for cough, shortness of breath and wheezing.   Cardiovascular: Negative for chest pain and palpitations.       Blood pressure slightly elevated today.   Gastrointestinal: Negative for constipation.  Musculoskeletal: Negative for back  pain and myalgias.  Skin: Positive for rash.       Rash present on right upper chest, right under arm, and right upper back. Patches of painful, red, blister like rash.   Neurological: Negative for dizziness, weakness and headaches.  Psychiatric/Behavioral: The patient is not nervous/anxious.   All other systems reviewed and are negative.      Objective:    Physical Exam Vitals and nursing note reviewed.  Constitutional:      Appearance: Normal appearance. She is well-developed.  HENT:     Head: Normocephalic.  Eyes:     Pupils: Pupils are equal, round, and reactive to light.  Cardiovascular:     Rate and Rhythm: Normal rate and regular rhythm.     Heart sounds:  Normal heart sounds.  Pulmonary:     Effort: Pulmonary effort is normal.     Breath sounds: Normal breath sounds.  Abdominal:     Palpations: Abdomen is soft.  Musculoskeletal:        General: Normal range of motion.     Cervical back: Normal range of motion and neck supple.  Skin:    General: Skin is warm and dry.     Comments: Vesicular rash on right upper chest, right axilla, and right upper back. There is a great deal of erythema present. Is warm to touch. Skin is currently intact and there is no drainage present.   Neurological:     General: No focal deficit present.     Mental Status: She is alert and oriented to person, place, and time.  Psychiatric:        Mood and Affect: Mood normal.        Behavior: Behavior normal.        Thought Content: Thought content normal.        Judgment: Judgment normal.      Today's Vitals   07/02/20 1424  BP: (!) 146/80  Pulse: 75  Temp: 97.6 F (36.4 C)  SpO2: 96%  Weight: 201 lb 12.8 oz (91.5 kg)  Height: 5\' 1"  (1.549 m)   Body mass index is 38.13 kg/m.   Wt Readings from Last 3 Encounters:  07/02/20 201 lb 12.8 oz (91.5 kg)  04/17/18 189 lb 6.4 oz (85.9 kg)  08/02/17 192 lb 8 oz (87.3 kg)    Health Maintenance Due  Topic Date Due  . Hepatitis C Screening  Never done  . COVID-19 Vaccine (1) Never done  . HIV Screening  Never done  . INFLUENZA VACCINE  Never done  . PAP SMEAR-Modifier  08/02/2020    There are no preventive care reminders to display for this patient.   Lab Results  Component Value Date   TSH 4.460 08/25/2017   Lab Results  Component Value Date   WBC 8.6 08/25/2017   HGB 13.4 08/25/2017   HCT 40.0 08/25/2017   MCV 83 08/25/2017   PLT 223 08/25/2017   Lab Results  Component Value Date   NA 137 08/25/2017   K 4.3 08/25/2017   CO2 22 08/25/2017   GLUCOSE 118 (H) 08/25/2017   BUN 11 08/25/2017   CREATININE 0.69 08/25/2017   BILITOT 0.4 08/25/2017   ALKPHOS 76 08/25/2017   AST 14 08/25/2017    ALT 15 08/25/2017   PROT 6.5 08/25/2017   ALBUMIN 4.2 08/25/2017   CALCIUM 8.7 08/25/2017   Lab Results  Component Value Date   CHOL 185 08/25/2017   Lab Results  Component Value Date  HDL 40 08/25/2017   Lab Results  Component Value Date   LDLCALC 111 (H) 08/25/2017   Lab Results  Component Value Date   TRIG 171 (H) 08/25/2017   Lab Results  Component Value Date   CHOLHDL 4.6 (H) 08/25/2017   Lab Results  Component Value Date   HGBA1C 6.3 (H) 08/25/2017       Assessment & Plan:  .1. Herpes zoster without complication Start valacyclovir 1000mg  twice daily for 7 days. Apply previously prdered anti-inflammatory cream to effected areas to help reduce pain, itching, and irritation.  - valACYclovir (VALTREX) 1000 MG tablet; Take 1 tablet (1,000 mg total) by mouth 2 (two) times daily.  Dispense: 14 tablet; Refill: 0  2. Rash and nonspecific skin eruption Add medrol dose pack. Take as directed for 6 days. Apply anti-inflammatory cream as needed and as indicated for itching and irritation.  - methylPREDNISolone (MEDROL) 4 MG TBPK tablet; Take by mouth as directed for 6 days  Dispense: 21 tablet; Refill: 0  3. Elevated blood pressure reading in office without diagnosis of hypertension Likely elevated due to pain/inflammation. Patient to monitor closely and notify office if it stays elevated after resolution of shingles.   Problem List Items Addressed This Visit      Musculoskeletal and Integument   Rash and nonspecific skin eruption   Relevant Medications   methylPREDNISolone (MEDROL) 4 MG TBPK tablet     Other   Herpes zoster without complication - Primary   Relevant Medications   valACYclovir (VALTREX) 1000 MG tablet   Elevated blood pressure reading in office without diagnosis of hypertension       Meds ordered this encounter  Medications  . methylPREDNISolone (MEDROL) 4 MG TBPK tablet    Sig: Take by mouth as directed for 6 days    Dispense:  21 tablet     Refill:  0    Order Specific Question:   Supervising Provider    Answer:   D [[id number]]  . valACYclovir (VALTREX) 1000 MG tablet    Sig: Take 1 tablet (1,000 mg total) by mouth 2 (two) times daily.    Dispense:  14 tablet    Refill:  0    Order Specific Question:   Supervising Provider    Answer:   Nani Gasser D [[id number]]   Time spent with the patient was approximately 25 minutes. This time included reviewing progress notes, labs, imaging studies, and discussing plan for follow up.   Nani Gasser, NP

## 2020-07-16 ENCOUNTER — Telehealth: Payer: Self-pay | Admitting: Nurse Practitioner

## 2020-07-16 NOTE — Telephone Encounter (Signed)
Patient scheduled tomorrow.

## 2020-07-16 NOTE — Telephone Encounter (Signed)
Patient was seen for shingles and is healing up, she still has them but they are better and scabbing over and fading. All over her right arm it is aching at night and she think that it might be nerves, and is taking alieve but it is not helping enough. Please advise, thanks.

## 2020-07-16 NOTE — Telephone Encounter (Signed)
Please contact patient and schedule for in office apt to reassess. AS, CMA

## 2020-07-17 ENCOUNTER — Ambulatory Visit: Payer: 59 | Admitting: Nurse Practitioner

## 2020-07-21 ENCOUNTER — Ambulatory Visit: Payer: 59 | Admitting: Nurse Practitioner

## 2020-07-29 ENCOUNTER — Ambulatory Visit: Payer: 59 | Admitting: Nurse Practitioner

## 2020-10-04 IMAGING — MG DIGITAL SCREENING BILATERAL MAMMOGRAM WITH TOMO AND CAD
8 series · 8 of 24 positions shown · non-contrast
Comparison: Previous exam(s).

CLINICAL DATA: Screening.

EXAM:
DIGITAL SCREENING BILATERAL MAMMOGRAM WITH TOMO AND CAD

[R MLO synth-2D]
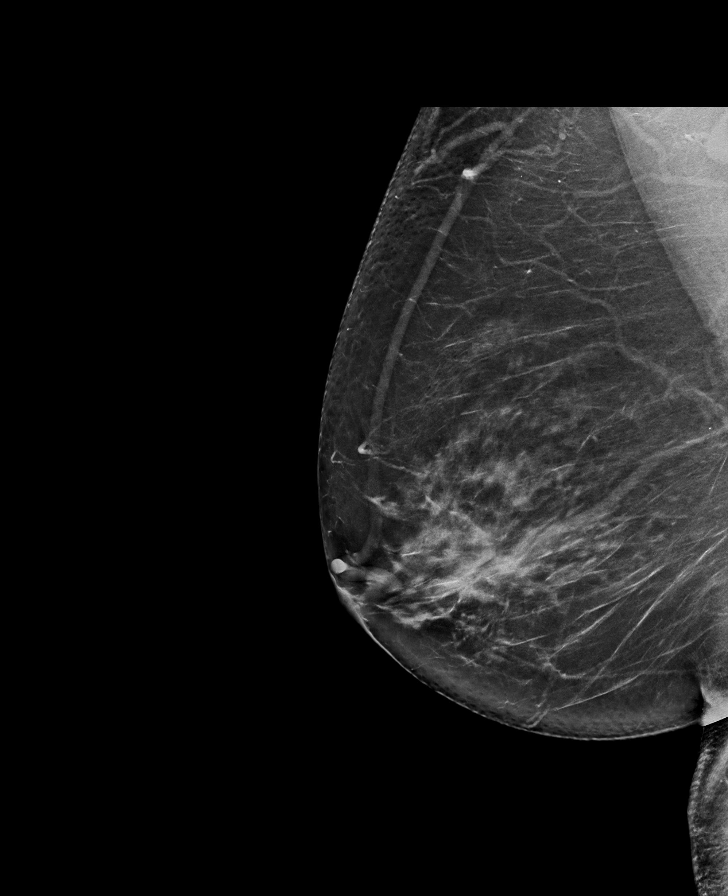

[L CC synth-2D]
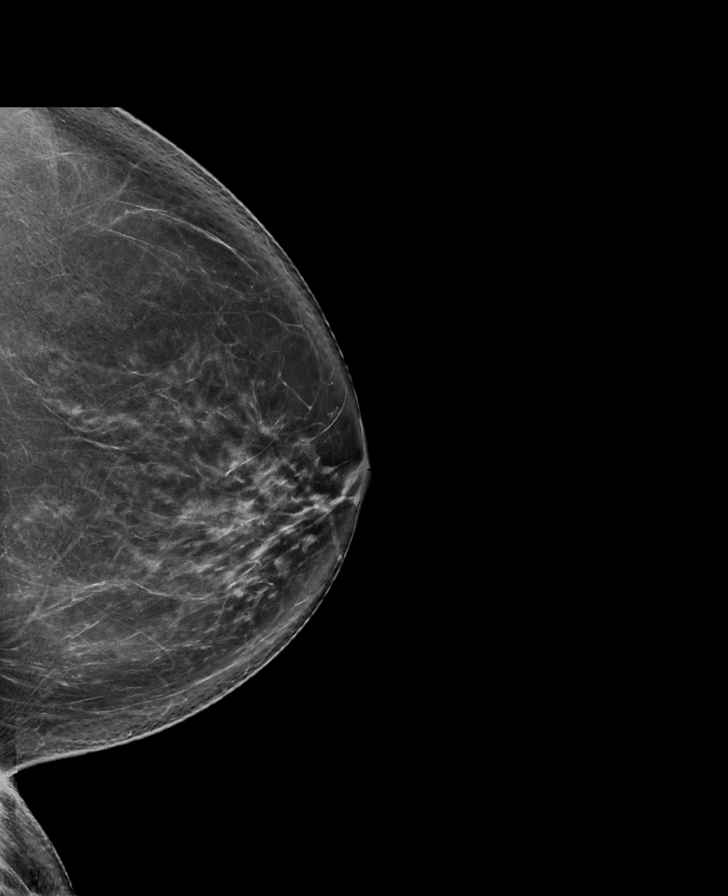

[L MLO synth-2D]
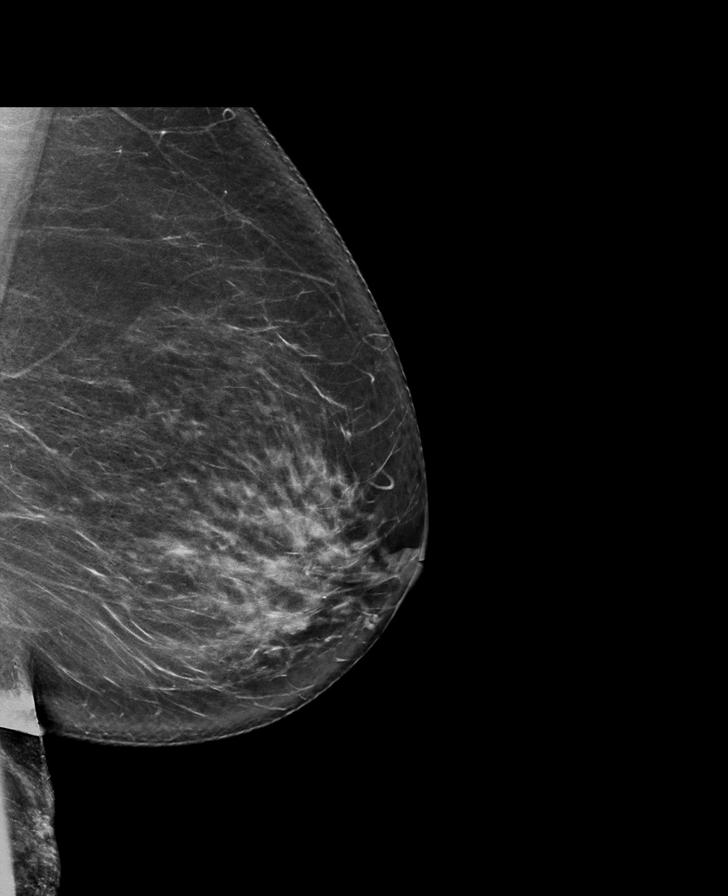

[R CC synth-2D]
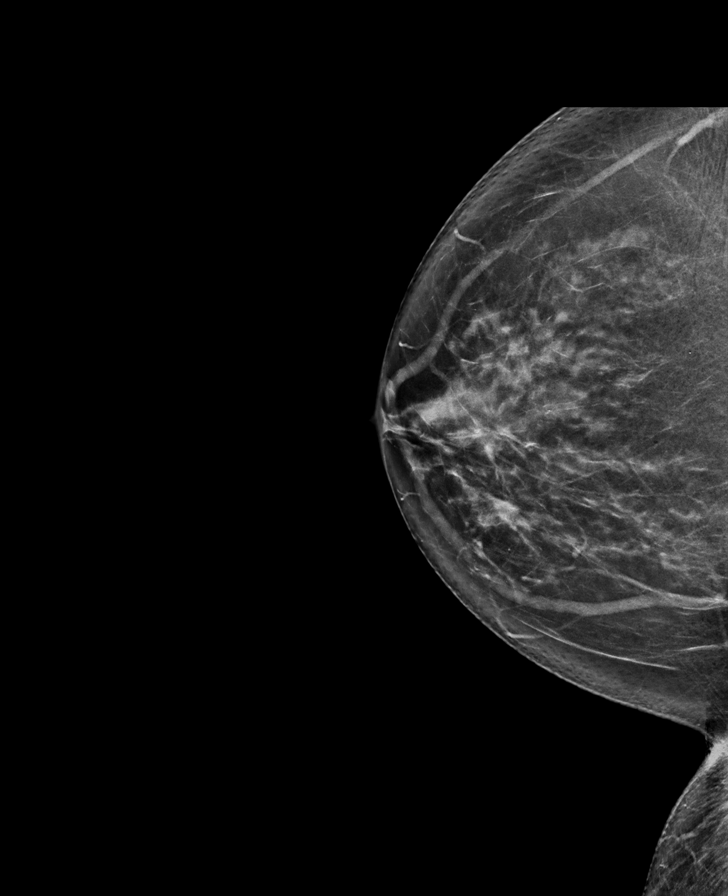

[L CC tomo · tomo slice 45/89.0]
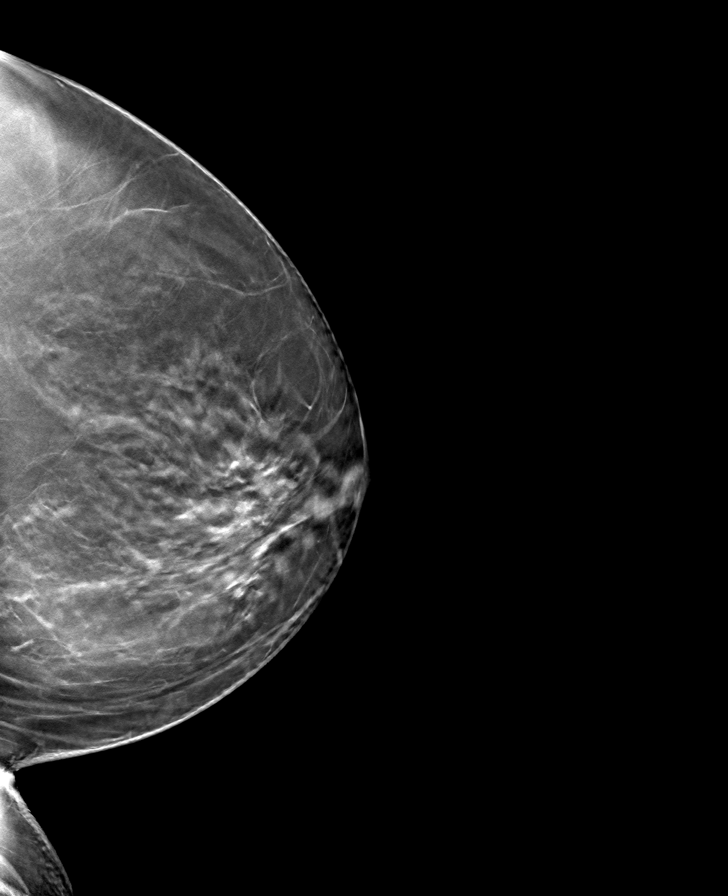

[R MLO tomo · tomo slice 44/87.0]
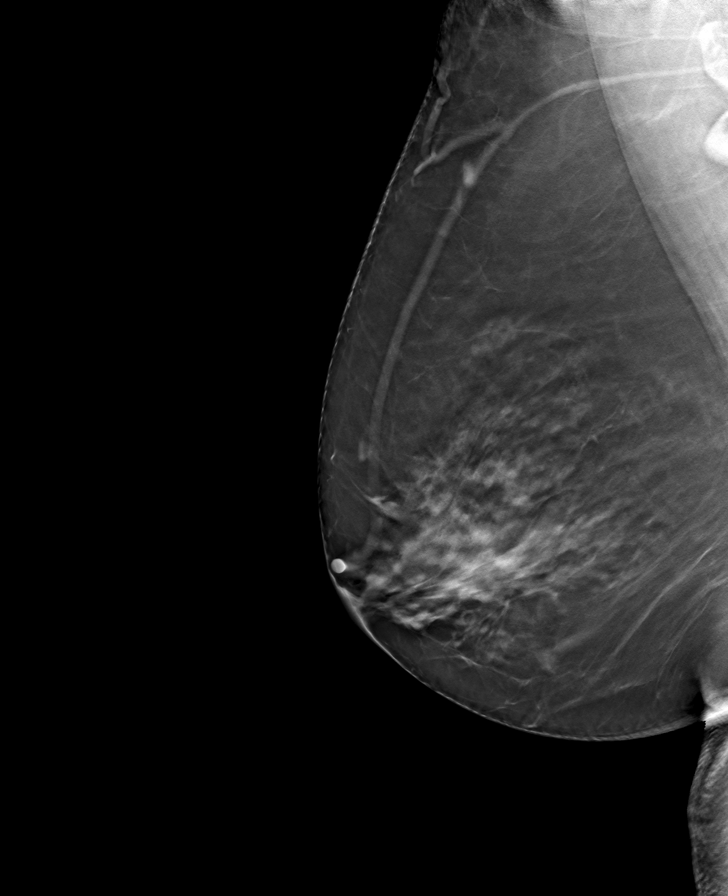

[R CC tomo · tomo slice 42/83.0]
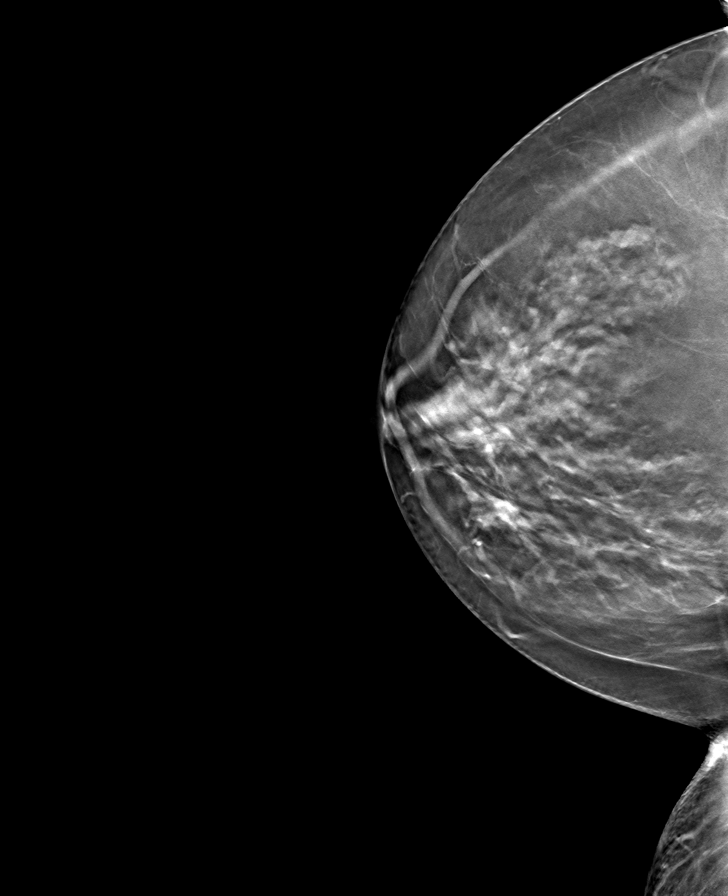

[L MLO tomo · tomo slice 43/84.0]
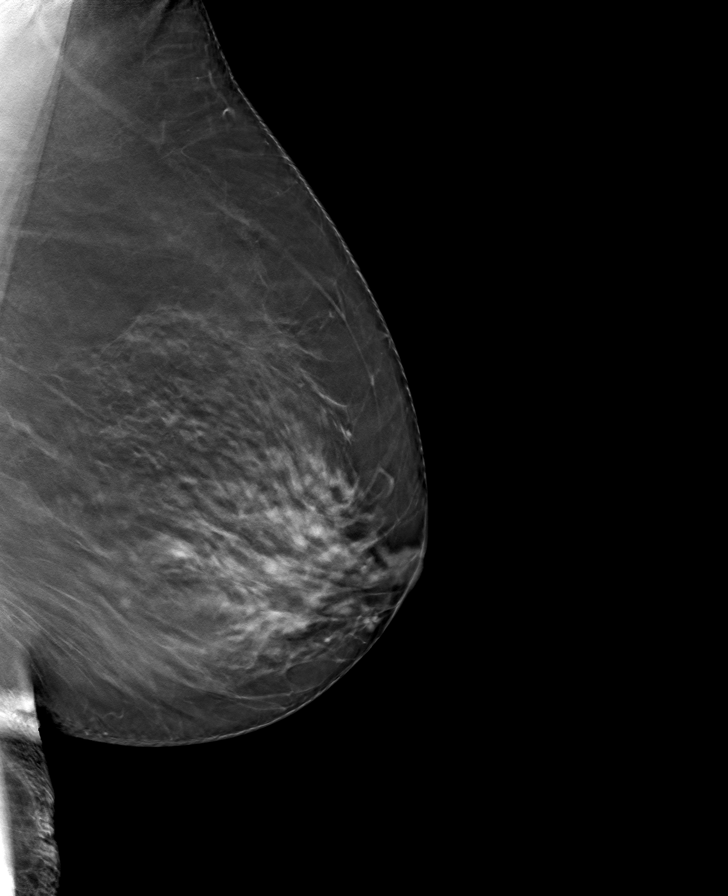

[8 of 24 positions shown; findings below may reference images not displayed]

ACR Breast Density Category c: The breast tissue is heterogeneously
dense, which may obscure small masses.
FINDINGS: There are no findings suspicious for malignancy. Images were
processed with CAD.
IMPRESSION: No mammographic evidence of malignancy. A result letter of this
screening mammogram will be mailed directly to the patient.

RECOMMENDATION:
Screening mammogram in one year. (Code:FT-U-LHB)

BI-RADS CATEGORY  1: Negative.

## 2021-07-06 ENCOUNTER — Ambulatory Visit (INDEPENDENT_AMBULATORY_CARE_PROVIDER_SITE_OTHER): Payer: Medicare (Managed Care) | Admitting: Nurse Practitioner

## 2021-07-06 ENCOUNTER — Other Ambulatory Visit: Payer: Self-pay

## 2021-07-06 ENCOUNTER — Encounter: Payer: Self-pay | Admitting: Nurse Practitioner

## 2021-07-06 VITALS — BP 147/83 | HR 78 | Temp 98.1°F | Ht 61.0 in | Wt 203.0 lb

## 2021-07-06 DIAGNOSIS — Z6838 Body mass index (BMI) 38.0-38.9, adult: Secondary | ICD-10-CM | POA: Insufficient documentation

## 2021-07-06 DIAGNOSIS — N390 Urinary tract infection, site not specified: Secondary | ICD-10-CM | POA: Insufficient documentation

## 2021-07-06 DIAGNOSIS — R319 Hematuria, unspecified: Secondary | ICD-10-CM | POA: Diagnosis not present

## 2021-07-06 LAB — POCT URINALYSIS DIP (CLINITEK)
Bilirubin, UA: NEGATIVE
Glucose, UA: NEGATIVE mg/dL
Ketones, POC UA: NEGATIVE mg/dL
Nitrite, UA: NEGATIVE
POC PROTEIN,UA: NEGATIVE
Spec Grav, UA: 1.025 (ref 1.010–1.025)
Urobilinogen, UA: 1 E.U./dL
pH, UA: 6.5 (ref 5.0–8.0)

## 2021-07-06 MED ORDER — NITROFURANTOIN MONOHYD MACRO 100 MG PO CAPS: 100.0000 mg | ORAL_CAPSULE | Freq: Two times a day (BID) | ORAL | 0 refills | Status: DC

## 2021-07-06 NOTE — Progress Notes (Signed)
Established patient visit ? ? ?Patient: Michelle Wheeler   DOB: January 28, 1956   65 y.o. Female  MRN: [id number] ?Visit Date: 07/06/2021 ? ?Chief Complaint  ?Patient presents with  ? Urinary Tract Infection  ? ?Subjective  ?  ?HPI  ?The patient is here for acute visit ?-left flank pain.  ?-hematuria intermittently when wiping.  ?-has increased water intake  ?-tried OTC AZO and drinking cranberry juice. Has not noted much improvement.  ?-taking aleve yesterday did help. Helped her to get some rest.  ? ?Medications: ?Outpatient Medications Prior to Visit  ?Medication Sig  ? Cholecalciferol (VITAMIN D3) 2000 units TABS Take 1 tablet by mouth daily.  ? methylPREDNISolone (MEDROL) 4 MG TBPK tablet Take by mouth as directed for 6 days  ? Multiple Vitamin (MULTIVITAMIN) tablet Take 1 tablet by mouth daily.  ? valACYclovir (VALTREX) 1000 MG tablet Take 1 tablet (1,000 mg total) by mouth 2 (two) times daily.  ? ?No facility-administered medications prior to visit.  ? ? ?Review of Systems  ?Constitutional:  Positive for appetite change and fatigue. Negative for activity change, chills and fever.  ?HENT:  Negative for congestion, postnasal drip, rhinorrhea, sinus pressure, sinus pain, sneezing and sore throat.   ?Eyes: Negative.   ?Respiratory:  Negative for cough, chest tightness, shortness of breath and wheezing.   ?Cardiovascular:  Negative for chest pain and palpitations.  ?Gastrointestinal:  Positive for nausea. Negative for abdominal pain, constipation, diarrhea and vomiting.  ?Endocrine: Negative for cold intolerance, heat intolerance, polydipsia and polyuria.  ?Genitourinary:  Positive for dysuria, flank pain, frequency and hematuria. Negative for dyspareunia and urgency.  ?Musculoskeletal:  Positive for back pain. Negative for arthralgias and myalgias.  ?Skin:  Negative for rash.  ?Allergic/Immunologic: Negative for environmental allergies.  ?Neurological:  Negative for dizziness, weakness and headaches.  ?Hematological:   Negative for adenopathy.  ?Psychiatric/Behavioral:  The patient is not nervous/anxious.   ? ? Objective  ?  ? ?Today's Vitals  ? 07/06/21 1613 07/06/21 1629  ?BP: (!) 152/77 (!) 147/83  ?Pulse: 78   ?Temp: 98.1 ?F (36.7 ?C)   ?SpO2: 95%   ?Weight: 203 lb (92.1 kg)   ?Height: 5\' 1"  (1.549 m)   ? ?Body mass index is 38.36 kg/m?.  ? ?Physical Exam ?Vitals and nursing note reviewed.  ?Constitutional:   ?   Appearance: Normal appearance. She is well-developed.  ?HENT:  ?   Head: Normocephalic.  ?Eyes:  ?   Pupils: Pupils are equal, round, and reactive to light.  ?Cardiovascular:  ?   Rate and Rhythm: Normal rate and regular rhythm.  ?   Pulses: Normal pulses.  ?   Heart sounds: Normal heart sounds.  ?Pulmonary:  ?   Effort: Pulmonary effort is normal.  ?   Breath sounds: Normal breath sounds.  ?Abdominal:  ?   Palpations: Abdomen is soft.  ?Genitourinary: ?   Comments: Urine sample positive for trace RBC and trace WBC.  ?Musculoskeletal:     ?   General: Normal range of motion.  ?   Cervical back: Normal range of motion and neck supple.  ?Lymphadenopathy:  ?   Cervical: No cervical adenopathy.  ?Skin: ?   General: Skin is warm and dry.  ?   Capillary Refill: Capillary refill takes less than 2 seconds.  ?Neurological:  ?   General: No focal deficit present.  ?   Mental Status: She is alert and oriented to person, place, and time.  ?Psychiatric:     ?  Mood and Affect: Mood normal.     ?   Behavior: Behavior normal.     ?   Thought Content: Thought content normal.     ?   Judgment: Judgment normal.  ?  ? ? ?Results for orders placed or performed in visit on 07/06/21  ?POCT URINALYSIS DIP (CLINITEK)  ?Result Value Ref Range  ? Color, UA yellow yellow  ? Clarity, UA clear clear  ? Glucose, UA negative negative mg/dL  ? Bilirubin, UA negative negative  ? Ketones, POC UA negative negative mg/dL  ? Spec Grav, UA 1.025 1.010 - 1.025  ? Blood, UA trace-intact (A) negative  ? pH, UA 6.5 5.0 - 8.0  ? POC PROTEIN,UA negative  negative, trace  ? Urobilinogen, UA 1.0 0.2 or 1.0 E.U./dL  ? Nitrite, UA Negative Negative  ? Leukocytes, UA Trace (A) Negative  ? ? Assessment & Plan  ?  ? ?1. Urinary tract infection with hematuria, site unspecified ?Start macrobid 100mg  twice daily for next five days. Will send urine for culture and sensitivity and adjust antibiotics as indicated. Recommend she increase water intake and try OTC AZO to help with bladder pain and spasms.  ?- POCT URINALYSIS DIP (CLINITEK) ?- Urine Culture; Future ?- Urine Culture ?- nitrofurantoin, macrocrystal-monohydrate, (MACROBID) 100 MG capsule; Take 1 capsule (100 mg total) by mouth 2 (two) times daily.  Dispense: 10 capsule; Refill: 0 ? ?2. Body mass index (BMI) of 38.0-38.9 in adult ?Discussed lowering calorie intake to 1500 calories per day and incorporating exercise into daily routine to help lose weight.  ? ? ?Return for prn worsening or persistent symptoms.  ?   ? ? ? ?Ronnell Freshwater, NP  ?[facility] ?[phone] (phone) ?[phone] (fax) ? ?[facility] ?

## 2021-07-09 LAB — URINE CULTURE

## 2022-08-27 DIAGNOSIS — H524 Presbyopia: Secondary | ICD-10-CM | POA: Diagnosis not present

## 2022-09-07 DIAGNOSIS — Z0100 Encounter for examination of eyes and vision without abnormal findings: Secondary | ICD-10-CM | POA: Diagnosis not present

## 2022-09-13 ENCOUNTER — Telehealth: Payer: Self-pay | Admitting: *Deleted

## 2022-09-13 NOTE — Telephone Encounter (Signed)
Pt LVM on machine and I tried to call back had to LM for pt to call office back.

## 2022-09-14 DIAGNOSIS — M545 Low back pain, unspecified: Secondary | ICD-10-CM | POA: Diagnosis not present

## 2023-02-25 ENCOUNTER — Encounter: Payer: Self-pay | Admitting: Family Medicine

## 2023-02-25 ENCOUNTER — Ambulatory Visit (INDEPENDENT_AMBULATORY_CARE_PROVIDER_SITE_OTHER): Payer: Medicare HMO | Admitting: Family Medicine

## 2023-02-25 VITALS — BP 146/79 | HR 75 | Ht 61.0 in | Wt 203.4 lb

## 2023-02-25 DIAGNOSIS — R399 Unspecified symptoms and signs involving the genitourinary system: Secondary | ICD-10-CM | POA: Insufficient documentation

## 2023-02-25 DIAGNOSIS — R1032 Left lower quadrant pain: Secondary | ICD-10-CM

## 2023-02-25 LAB — POCT URINALYSIS DIP (CLINITEK)
Bilirubin, UA: NEGATIVE
Glucose, UA: NEGATIVE mg/dL
Ketones, POC UA: NEGATIVE mg/dL
Leukocytes, UA: NEGATIVE
Nitrite, UA: NEGATIVE
Spec Grav, UA: >=1.03 — AB (ref 1.010–1.025)
Urobilinogen, UA: 0.2 U/dL
pH, UA: 6 (ref 5.0–8.0)

## 2023-02-25 MED ORDER — TAMSULOSIN HCL 0.4 MG PO CAPS: 0.4000 mg | ORAL_CAPSULE | Freq: Every day | ORAL | 0 refills | Status: DC

## 2023-02-25 NOTE — Patient Instructions (Signed)
It was nice to see you today,  We addressed the following topics today: -Your urine dipstick test was negative for infection.  That said, I will still send it for culture to confirm - I believe it is more likely a kidney stone.  I have ordered a CT scan to confirm this.  I have ordered it to be done at [facility].  Someone should get back to you today regarding this. - I have prescribed Flomax.  This can help pass the stone.  I would also recommend ibuprofen and Tylenol as needed. - If you develop fevers, chills, nausea vomiting, other signs of systemic infection you should let us know or see a medical provider if it is over the weekend and you cannot reach [place].  Have a great day,  Frederic Jericho, MD

## 2023-02-25 NOTE — Progress Notes (Signed)
   Acute Office Visit  Subjective:     Patient ID: Michelle Wheeler, female    DOB: 16-Dec-1955, 67 y.o.   MRN: [id number]  Chief Complaint  Patient presents with   Urinary Tract Infection    HPI Patient is in today for UTI symptoms.  Patient states her symptoms started last Friday.  These include increased urinary frequency left lower sided abdominal pain.  And gross hematuria.  No fevers or chills.  No urinary odor.  No dysuria.  Patient has no history of kidney stones.  Patient has had UTIs in the past that have been associated with gross hematuria.   ROS      Objective:    BP (!) 146/79   Pulse 75   Ht 5\' 1"  (1.549 m)   Wt 203 lb 6.4 oz (92.3 kg)   LMP 04/09/2010   SpO2 96%   BMI 38.43 kg/m    Physical Exam General: Alert, oriented GI: Soft, tender to palpation in the left lower quadrant.  Normal bowel sounds MSK: No CVA tenderness.  Results for orders placed or performed in visit on 02/25/23  POCT URINALYSIS DIP (CLINITEK)  Result Value Ref Range   Color, UA yellow yellow   Clarity, UA clear clear   Glucose, UA negative negative mg/dL   Bilirubin, UA negative negative   Ketones, POC UA negative negative mg/dL   Spec Grav, UA >=1.027 (A) 1.010 - 1.025   Blood, UA large (A) negative   pH, UA 6.0 5.0 - 8.0   POC PROTEIN,UA trace negative, trace   Urobilinogen, UA 0.2 0.2 or 1.0 E.U./dL   Nitrite, UA Negative Negative   Leukocytes, UA Negative Negative        Assessment & Plan:   UTI symptoms Assessment & Plan: Patient complaining of 1 week of gross hematuria, left lower quadrant abdominal pain, increased urinary frequency.  No dysuria, fevers or chills.  Has had gross hematuria in the past with UTIs.  Urine dipstick was negative for nitrites and leukocyte esterase.  Positive for blood. Patient's symptoms seem more consistent with a possible kidney stone.  Has no history of kidney stones.  Will get CT stone study, sending urine culture off to confirm absence  of bacteria.  Prescribing Flomax and over-the-counter pain medications.  Patient agreeable to plan.  Orders: -     POCT URINALYSIS DIP (CLINITEK) -     Urine Culture  Left lower quadrant abdominal pain -     CT RENAL STONE STUDY; Future -     Tamsulosin HCl; Take 1 capsule (0.4 mg total) by mouth daily.  Dispense: 30 capsule; Refill: 0     Return in about 4 weeks (around 03/25/2023) for HTN, abdominal pain.  Sandre Kitty, MD

## 2023-02-25 NOTE — Assessment & Plan Note (Addendum)
Patient complaining of 1 week of gross hematuria, left lower quadrant abdominal pain, increased urinary frequency.  No dysuria, fevers or chills.  Has had gross hematuria in the past with UTIs.  Urine dipstick was negative for nitrites and leukocyte esterase.  Positive for blood. Patient's symptoms seem more consistent with a possible kidney stone.  Has no history of kidney stones.  Will get CT stone study, sending urine culture off to confirm absence of bacteria.  Prescribing Flomax and over-the-counter pain medications.  Patient agreeable to plan.

## 2023-02-27 LAB — URINE CULTURE

## 2023-03-01 ENCOUNTER — Ambulatory Visit (HOSPITAL_COMMUNITY)
Admission: RE | Admit: 2023-03-01 | Discharge: 2023-03-01 | Disposition: A | Discharge status: Home / Self Care | Payer: Medicare HMO | Source: Ambulatory Visit | Attending: Family Medicine | Admitting: Family Medicine

## 2023-03-01 DIAGNOSIS — N200 Calculus of kidney: Secondary | ICD-10-CM | POA: Diagnosis not present

## 2023-03-01 DIAGNOSIS — R1032 Left lower quadrant pain: Secondary | ICD-10-CM | POA: Diagnosis not present

## 2023-03-01 DIAGNOSIS — K5790 Diverticulosis of intestine, part unspecified, without perforation or abscess without bleeding: Secondary | ICD-10-CM | POA: Diagnosis not present

## 2023-03-01 DIAGNOSIS — K8020 Calculus of gallbladder without cholecystitis without obstruction: Secondary | ICD-10-CM | POA: Diagnosis not present

## 2023-03-02 ENCOUNTER — Telehealth: Payer: Self-pay

## 2023-03-02 NOTE — Telephone Encounter (Signed)
Pt is calling to see if she still needs to continue the Flomax since the CT showed the stone wasn't there but there were stones in her kidneys.

## 2023-03-03 ENCOUNTER — Encounter: Payer: Self-pay | Admitting: Family Medicine

## 2023-03-03 NOTE — Telephone Encounter (Signed)
She can stop taking the Flomax.  Ask her if she is still having pain and blood in her urine.  If so, we will need to refer her to the urologist.

## 2023-03-03 NOTE — Telephone Encounter (Signed)
Molli Knock  I sent her a MyChart message

## 2023-03-25 ENCOUNTER — Encounter: Payer: Self-pay | Admitting: Family Medicine

## 2023-03-25 ENCOUNTER — Ambulatory Visit (INDEPENDENT_AMBULATORY_CARE_PROVIDER_SITE_OTHER): Payer: Medicare HMO | Admitting: Family Medicine

## 2023-03-25 VITALS — BP 156/82 | HR 76 | Temp 98.3°F | Ht 61.0 in | Wt 203.8 lb

## 2023-03-25 DIAGNOSIS — R030 Elevated blood-pressure reading, without diagnosis of hypertension: Secondary | ICD-10-CM | POA: Diagnosis not present

## 2023-03-25 DIAGNOSIS — R310 Gross hematuria: Secondary | ICD-10-CM | POA: Diagnosis not present

## 2023-03-25 MED ORDER — AMLODIPINE BESYLATE 5 MG PO TABS: 5.0000 mg | ORAL_TABLET | Freq: Every day | ORAL | 3 refills | Status: DC

## 2023-03-25 NOTE — Assessment & Plan Note (Signed)
Patient's blood pressure elevated again today.  She is agreeable to starting blood pressure medication. - Start amlodipine 5 mg - Check blood pressure at home and bring in values at next visit - Approximately 6 weeks.

## 2023-03-25 NOTE — Patient Instructions (Signed)
It was nice to see you today,  We addressed the following topics today: -I am rechecking your urine for blood but I will also order some imaging.  I am going to talk to a radiologist to make sure I ordered the most appropriate imaging since you have already had a CT scan.  Someone will call you to schedule the appointment after I order it - I have started amlodipine for you.  You take this once a day.  Check your blood pressure at home.  Your goal should be 130/85 or less on most values. We will follow-up in 1 month  Have a great day,  Frederic Jericho, MD

## 2023-03-25 NOTE — Assessment & Plan Note (Addendum)
Initially thought to be due to kidney stones after UTI was ruled out.  CT did not show any significant obstructing stones.  She continued to episodes of gross hematuria after abdominal pain resolved - Repeat urine microscopy today - Will order CT cystogram

## 2023-03-25 NOTE — Progress Notes (Signed)
   Established Patient Office Visit  Subjective   Patient ID: Michelle Wheeler, female    DOB: 09/01/1955  Age: 67 y.o. MRN: [id number]  No chief complaint on file.   HPI  Hematuria/abdominal pain-abdominal pain has almost completely resolved.  Patient did have some more episodes of gross hematuria.  Most recent time was Sunday and lasted a few days.  Does not have any vaginal bleeding that she knows of.  We discussed her CT scan findings and how the small nonobstructing stones are unlikely to be causing continued hematuria.  Discussed her diverticuli and gallstones.  Patient not having any right upper quadrant pain or nausea.  We discussed further workup including additional imaging.  Patient agreeable to this  Hypertension.-We discussed patient's elevated blood pressure.  She does have a blood pressure cuff at home but does not measure her blood pressure at.  She is agreeable to starting blood pressure medication.  Also discussed weight loss and dietary changes.   The ASCVD Risk score (Arnett DK, et al., 2019) failed to calculate for the following reasons:   Cannot find a previous HDL lab   Cannot find a previous total cholesterol lab  Health Maintenance Due  Topic Date Due   Medicare Annual Wellness (AWV)  Never done   Hepatitis C Screening  Never done   MAMMOGRAM  12/05/2020   DEXA SCAN  Never done   Fecal DNA (Cologuard)  12/18/2021   COVID-19 Vaccine (1 - 2024-25 season) Never done      Objective:     BP (!) 156/82   Pulse 76   Temp 98.3 F (36.8 C) (Oral)   Ht 5\' 1"  (1.549 m)   Wt 203 lb 12 oz (92.4 kg)   LMP 04/09/2010   SpO2 95%   BMI 38.50 kg/m    Physical Exam General: Alert, oriented Pulmonary: No respiratory distress Psych affect   No results found for any visits on 03/25/23.      Assessment & Plan:   Gross hematuria Assessment & Plan: Initially thought to be due to kidney stones after UTI was ruled out.  CT did not show any significant obstructing  stones.  She continued to episodes of gross hematuria after abdominal pain resolved - Repeat urine microscopy today - Will order CT cystogram  Orders: -     Urine Microscopic -     CT CYSTOGRAM ABD/PELVIS; Future  Elevated blood pressure reading in office without diagnosis of hypertension Assessment & Plan: Patient's blood pressure elevated again today.  She is agreeable to starting blood pressure medication. - Start amlodipine 5 mg - Check blood pressure at home and bring in values at next visit - Approximately 6 weeks.   Other orders -     amLODIPine Besylate; Take 1 tablet (5 mg total) by mouth daily.  Dispense: 90 tablet; Refill: 3     Return in about 4 weeks (around 04/22/2023) for HTN, hematuria.    Sandre Kitty, MD

## 2023-03-26 LAB — URINALYSIS, MICROSCOPIC ONLY
Casts: NONE SEEN /[LPF]
Epithelial Cells (non renal): >10 /[HPF] — AB (ref 0–10)

## 2023-03-28 ENCOUNTER — Encounter: Payer: Self-pay | Admitting: Family Medicine

## 2023-04-20 ENCOUNTER — Other Ambulatory Visit: Payer: Self-pay | Admitting: Family Medicine

## 2023-04-20 DIAGNOSIS — R310 Gross hematuria: Secondary | ICD-10-CM

## 2023-05-05 ENCOUNTER — Ambulatory Visit: Payer: Medicare Other | Admitting: Urology

## 2023-05-05 ENCOUNTER — Encounter: Payer: Self-pay | Admitting: Urology

## 2023-05-05 ENCOUNTER — Telehealth (HOSPITAL_BASED_OUTPATIENT_CLINIC_OR_DEPARTMENT_OTHER): Payer: Self-pay

## 2023-05-05 VITALS — BP 160/76 | HR 87 | Ht 61.0 in | Wt 190.0 lb

## 2023-05-05 DIAGNOSIS — R310 Gross hematuria: Secondary | ICD-10-CM | POA: Diagnosis not present

## 2023-05-05 DIAGNOSIS — R1032 Left lower quadrant pain: Secondary | ICD-10-CM

## 2023-05-05 DIAGNOSIS — N200 Calculus of kidney: Secondary | ICD-10-CM

## 2023-05-05 NOTE — Progress Notes (Signed)
   Assessment: 1. Gross hematuria   2. Left lower quadrant pain   3. Nephrolithiasis      Plan: Diagnosis and evaluation of gross hematuria discussed in detail today with patient. Will obtain CT-hematuria protocol and follow-up for cystoscopy. Will request cancellation of CT cystogram that has been ordered. CBC, CMP today  Chief Complaint: gross hematuria, left lower quadrant pain  History of Present Illness:  Michelle Wheeler is a 68 y.o. female who is seen in consultation from Sandre Kitty, MD for evaluation of gross hematuria and left lower quadrant pain. Patient reports that approximately 6 months ago she began having intermittent left lower quadrant pain that she describes as dull just a pressure type sensation.  Subsequently she began having in October 2024 associated gross hematuria.  That usually occurred with the left lower quadrant discomfort.  On evaluation she has had no evidence of UTI.  She denies any fever or other constitutional complaints.  She is a lifelong non-smoker and not exposed to secondhand smoke. No evidence of occupational exposure to urothelial carcinogens.  Patient did undergo a CT stone study 03/01/2023 which was interpreted as showing no evidence of hydro or ureteral calculi.  There were noted to be tiny nonobstructing left renal calculi.  On my review there is a subtle area of calcification in the left distal ureter question stone.   Past Medical History:  History reviewed. No pertinent past medical history.  Past Surgical History:  History reviewed. No pertinent surgical history.  Allergies:  Allergies  Allergen Reactions   Codeine Other (See Comments)    hyperactivity   Morpholine Salicylate Other (See Comments)    Family History:  Family History  Problem Relation Age of Onset   Diabetes Father    Cancer Father        kidney   Hyperlipidemia Father    Hypertension Father    Diabetes Paternal Uncle    Hypertension Paternal Uncle     Diabetes Mother    Diabetes Paternal Aunt    Diabetes Paternal Uncle    Hypertension Paternal Uncle    Diabetes Paternal Uncle    Hypertension Paternal Uncle     Social History:  Social History   Tobacco Use   Smoking status: Never   Smokeless tobacco: Never  Substance Use Topics   Alcohol use: No   Drug use: No    Review of symptoms:  Constitutional:  Negative for unexplained weight loss, night sweats, fever, chills ENT:  Negative for nose bleeds, sinus pain, painful swallowing CV:  Negative for chest pain, shortness of breath, exercise intolerance, palpitations, loss of consciousness Resp:  Negative for cough, wheezing, shortness of breath GI:  Negative for nausea, vomiting, diarrhea, bloody stools GU:  Positives noted in HPI; otherwise negative for gross hematuria, dysuria, urinary incontinence Neuro:  Negative for seizures, poor balance, limb weakness, slurred speech Psych:  Negative for lack of energy, depression, anxiety Endocrine:  Negative for polydipsia, polyuria, symptoms of hypoglycemia (dizziness, hunger, sweating) Hematologic:  Negative for anemia, purpura, petechia, prolonged or excessive bleeding, use of anticoagulants  Allergic:  Negative for difficulty breathing or choking as a result of exposure to anything; no shellfish allergy; no allergic response (rash/itch) to materials, foods  Physical exam: BP (!) 160/76   Pulse 87   Ht 5\' 1"  (1.549 m)   Wt 190 lb (86.2 kg)   LMP 04/09/2010   BMI 35.90 kg/m  GENERAL APPEARANCE:  Well appearing, well developed, well nourished, NAD

## 2023-05-05 NOTE — Addendum Note (Signed)
Addended by: Carolin Coy on: 05/05/2023 02:30 PM   Modules accepted: Orders

## 2023-05-06 ENCOUNTER — Ambulatory Visit: Payer: Medicare HMO | Admitting: Family Medicine

## 2023-05-06 LAB — COMPREHENSIVE METABOLIC PANEL
ALT: 16 [IU]/L (ref 0–32)
AST: 18 [IU]/L (ref 0–40)
Albumin: 4.9 g/dL (ref 3.9–4.9)
Alkaline Phosphatase: 97 [IU]/L (ref 44–121)
BUN/Creatinine Ratio: 20 (ref 12–28)
BUN: 13 mg/dL (ref 8–27)
Bilirubin Total: 0.3 mg/dL (ref 0.0–1.2)
CO2: 26 mmol/L (ref 20–29)
Calcium: 10 mg/dL (ref 8.7–10.3)
Chloride: 100 mmol/L (ref 96–106)
Creatinine, Ser: 0.66 mg/dL (ref 0.57–1.00)
Globulin, Total: 3.1 g/dL (ref 1.5–4.5)
Glucose: 100 mg/dL — ABNORMAL HIGH (ref 70–99)
Potassium: 4.6 mmol/L (ref 3.5–5.2)
Sodium: 144 mmol/L (ref 134–144)
Total Protein: 8 g/dL (ref 6.0–8.5)
eGFR: 96 mL/min/{1.73_m2} (ref >59)

## 2023-05-06 LAB — CBC
Hematocrit: 49.6 % — ABNORMAL HIGH (ref 34.0–46.6)
Hemoglobin: 16.1 g/dL — ABNORMAL HIGH (ref 11.1–15.9)
MCH: 28 pg (ref 26.6–33.0)
MCHC: 32.5 g/dL (ref 31.5–35.7)
MCV: 86 fL (ref 79–97)
Platelets: 259 10*3/uL (ref 150–450)
RBC: 5.74 x10E6/uL — ABNORMAL HIGH (ref 3.77–5.28)
RDW: 13.2 % (ref 11.7–15.4)
WBC: 9 10*3/uL (ref 3.4–10.8)

## 2023-05-09 LAB — URINALYSIS, ROUTINE W REFLEX MICROSCOPIC
Bilirubin, UA: NEGATIVE
Glucose, UA: NEGATIVE
Ketones, UA: NEGATIVE
Nitrite, UA: NEGATIVE
RBC, UA: NEGATIVE
Specific Gravity, UA: 1.025 (ref 1.005–1.030)
Urobilinogen, Ur: 0.2 mg/dL (ref 0.2–1.0)
pH, UA: 6 (ref 5.0–7.5)

## 2023-05-09 LAB — MICROSCOPIC EXAMINATION

## 2023-05-13 ENCOUNTER — Ambulatory Visit (HOSPITAL_BASED_OUTPATIENT_CLINIC_OR_DEPARTMENT_OTHER)
Admission: RE | Admit: 2023-05-13 | Discharge: 2023-05-13 | Disposition: A | Discharge status: Home / Self Care | Payer: Medicare Other | Source: Ambulatory Visit | Attending: Urology | Admitting: Urology

## 2023-05-13 DIAGNOSIS — R310 Gross hematuria: Secondary | ICD-10-CM

## 2023-05-13 MED ORDER — IOHEXOL 300 MG/ML  SOLN: 125.0000 mL | Freq: Once | INTRAMUSCULAR | Status: DC | PRN

## 2023-05-19 ENCOUNTER — Ambulatory Visit (HOSPITAL_BASED_OUTPATIENT_CLINIC_OR_DEPARTMENT_OTHER)
Admission: RE | Admit: 2023-05-19 | Discharge: 2023-05-19 | Disposition: A | Discharge status: Home / Self Care | Payer: Medicare Other | Source: Ambulatory Visit | Attending: Urology | Admitting: Urology

## 2023-05-19 ENCOUNTER — Other Ambulatory Visit: Payer: Medicare Other | Admitting: Urology

## 2023-05-19 DIAGNOSIS — K5730 Diverticulosis of large intestine without perforation or abscess without bleeding: Secondary | ICD-10-CM | POA: Diagnosis not present

## 2023-05-19 DIAGNOSIS — R310 Gross hematuria: Secondary | ICD-10-CM | POA: Diagnosis not present

## 2023-05-19 DIAGNOSIS — K8020 Calculus of gallbladder without cholecystitis without obstruction: Secondary | ICD-10-CM | POA: Diagnosis not present

## 2023-05-19 DIAGNOSIS — N200 Calculus of kidney: Secondary | ICD-10-CM | POA: Diagnosis not present

## 2023-05-19 MED ORDER — IOHEXOL 300 MG/ML  SOLN
125.0000 mL | Freq: Once | INTRAMUSCULAR | Status: AC | PRN
  Administered 2023-05-19: 125 mL via INTRAVENOUS

## 2023-05-25 ENCOUNTER — Other Ambulatory Visit: Payer: Medicare Other | Admitting: Urology

## 2023-05-26 ENCOUNTER — Other Ambulatory Visit: Payer: Medicare Other | Admitting: Urology

## 2023-05-31 ENCOUNTER — Encounter: Payer: Self-pay | Admitting: Urology

## 2023-05-31 ENCOUNTER — Ambulatory Visit: Payer: Medicare Other | Admitting: Urology

## 2023-05-31 VITALS — BP 161/82 | HR 90 | Ht 61.0 in | Wt 190.0 lb

## 2023-05-31 DIAGNOSIS — R82998 Other abnormal findings in urine: Secondary | ICD-10-CM | POA: Diagnosis not present

## 2023-05-31 DIAGNOSIS — R896 Abnormal cytological findings in specimens from other organs, systems and tissues: Secondary | ICD-10-CM | POA: Diagnosis not present

## 2023-05-31 DIAGNOSIS — R310 Gross hematuria: Secondary | ICD-10-CM | POA: Diagnosis not present

## 2023-05-31 MED ORDER — SULFAMETHOXAZOLE-TRIMETHOPRIM 800-160 MG PO TABS
1.0000 | ORAL_TABLET | Freq: Once | ORAL | Status: AC
  Administered 2023-05-31: 1 via ORAL

## 2023-05-31 NOTE — Progress Notes (Addendum)
   Assessment: 1. Gross hematuria     Plan: Today I discussed with the patient that she may have passed a small left-sided stone which may have accounted for hematuria and left lower quadrant discomfort.  Will need to follow-up final reading of CT scan and results of bladder washing for cytology.  If negative patient will return in 6 months for recheck.    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 Urine cytology returned with atypia with negative FISH.  Chief Complaint: Chief Complaint  Patient presents with   Hematuria    HPI: Michelle Wheeler is a 68 y.o. female who presents for continued evaluation of gross hematuria associated with left lower quadrant pain. Please see my note 05/05/2023 at the time of initial visit for detailed history. Patient has undergone CT hematuria protocol-final report pending Patient did undergo CT stone study 02/2023 which showed no evidence of hydro or ureteral stones.  There were tiny nonobstructing left renal calculi.  On my review I was suspicious for a tiny left distal stone.  On the hematuria scan done recently on noncontrast imaging I do not see it that same finding.  Patient may have passed a small calculus.  Patient feels much better she is not having any significant left lower quadrant pain and has had no significant recent gross hematuria.  Urinalysis today is entirely clear   Portions of the above documentation were copied from a prior visit for review purposes only.  Allergies: Allergies  Allergen Reactions   Codeine Other (See Comments)    hyperactivity   Morpholine Salicylate Other (See Comments)    PMH: No past medical history on file.  PSH: No past surgical history on file.  SH: Social History   Tobacco Use   Smoking status: Never   Smokeless tobacco: Never  Substance Use Topics   Alcohol use: No   Drug use: No     ROS: Constitutional:  Negative for fever, chills, weight loss CV: Negative for chest pain, previous MI, hypertension Respiratory:  Negative for shortness of breath, wheezing, sleep apnea, frequent cough GI:  Negative for nausea, vomiting, bloody stool, GERD  PE: BP (!) 161/82   Pulse 90   Ht 5\' 1"  (1.549 m)   Wt 190 lb (86.2 kg)   LMP 04/09/2010   BMI 35.90 kg/m     Results: UA is negative, dipstick negative for blood   Procedure: Cystoscopy  Indication: Gross hematuria  Description of procedure: Patient was brought to the procedure room where she was correctly identified.  Procedure was again reviewed with patient and informed consent was obtained.  Patient was positioned in the dorsolithotomy position and her external genitalia were prepped and draped in the usual fashion.  Preprocedural timeout was performed.  Flexible cystoscopy was subsequently performed.  The urethra appeared normal.  The bladder was carefully entered and systematically reviewed.  The ureteral openings appeared normal.  There were no focal mucosal lesions appreciated.  There was noted to be some squamous metaplasia on the trigone.  Procedure well-tolerated.  Bladder washing was obtained for cytology.

## 2023-05-31 NOTE — Addendum Note (Signed)
Addended by: Carolin Coy on: 05/31/2023 02:03 PM   Modules accepted: Orders

## 2023-06-01 ENCOUNTER — Telehealth: Payer: Self-pay

## 2023-06-01 NOTE — Telephone Encounter (Signed)
Left msg for a return call from patient regarding CT results.

## 2023-06-01 NOTE — Telephone Encounter (Signed)
-----   Message from Joline Maxcy sent at 05/31/2023  3:05 PM EST ----- Regarding: Ct scan report and cardiology referral Malakye Nolden-- please call  her.  CT scan shows no worrisome issues from GU standpoint. However-- incidental note was made of a calcified lesion involving the atrium of the heart.  It was recommended that she have a cardiology referral for evaluation.  I am copying her PCP (Dr. Constance Goltz) so he can make referral to cards of his choice.  thanks

## 2023-06-02 ENCOUNTER — Telehealth: Payer: Self-pay | Admitting: *Deleted

## 2023-06-02 ENCOUNTER — Telehealth: Payer: Self-pay

## 2023-06-02 ENCOUNTER — Other Ambulatory Visit: Payer: Self-pay | Admitting: Family Medicine

## 2023-06-02 DIAGNOSIS — I5189 Other ill-defined heart diseases: Secondary | ICD-10-CM | POA: Insufficient documentation

## 2023-06-02 LAB — URINALYSIS, ROUTINE W REFLEX MICROSCOPIC
Bilirubin, UA: NEGATIVE
Glucose, UA: NEGATIVE
Ketones, UA: NEGATIVE
Nitrite, UA: NEGATIVE
Protein,UA: NEGATIVE
RBC, UA: NEGATIVE
Specific Gravity, UA: 1.025 (ref 1.005–1.030)
Urobilinogen, Ur: 0.2 mg/dL (ref 0.2–1.0)
pH, UA: 5.5 (ref 5.0–7.5)

## 2023-06-02 NOTE — Telephone Encounter (Signed)
LVM for pt to call office to ask about below. Will also send a Jaquita Rector, Mabeline Caras, MD  [facility] Can you call her and ask her if she has a preference for cardiology providers?  I am going to put in a cardiology referral for the finding on her CT scan.  I tried calling her but got a Engineer, technical sales.

## 2023-06-02 NOTE — Telephone Encounter (Signed)
Copied from CRM [id number]. Topic: Referral - Question >> Jun 02, 2023 10:18 AM Herbert Seta B wrote: Reason for CRM: Patient calling to advise PCP that she does not have a preference for who Cardiology referral is sent to.

## 2023-06-13 ENCOUNTER — Encounter: Payer: Self-pay | Admitting: Urology

## 2023-08-19 ENCOUNTER — Encounter: Payer: Self-pay | Admitting: Cardiology

## 2023-08-19 ENCOUNTER — Ambulatory Visit: Attending: Cardiology | Admitting: Cardiology

## 2023-08-19 VITALS — BP 134/74 | HR 54 | Ht 61.0 in | Wt 201.0 lb

## 2023-08-19 DIAGNOSIS — E7800 Pure hypercholesterolemia, unspecified: Secondary | ICD-10-CM | POA: Diagnosis not present

## 2023-08-19 DIAGNOSIS — R7303 Prediabetes: Secondary | ICD-10-CM | POA: Diagnosis not present

## 2023-08-19 DIAGNOSIS — I10 Essential (primary) hypertension: Secondary | ICD-10-CM | POA: Diagnosis not present

## 2023-08-19 DIAGNOSIS — I5189 Other ill-defined heart diseases: Secondary | ICD-10-CM

## 2023-08-19 MED ORDER — ASPIRIN 81 MG PO TBEC: 81.0000 mg | DELAYED_RELEASE_TABLET | Freq: Every day | ORAL | Status: AC

## 2023-08-19 NOTE — Patient Instructions (Signed)
 Medication Instructions:  START Aspirin 81 mg take one tablet by mouth daily   *If you need a refill on your cardiac medications before your next appointment, please call your pharmacy*  Lab Work: Lipid panel  A1C  If you have labs (blood work) drawn today and your tests are completely normal, you will receive your results only by: MyChart Message (if you have MyChart) OR A paper copy in the mail If you have any lab test that is abnormal or we need to change your treatment, we will call you to review the results.  Testing/Procedures: Echo  Your physician has requested that you have an echocardiogram. Echocardiography is a painless test that uses sound waves to create images of your heart. It provides your doctor with information about the size and shape of your heart and how well your heart's chambers and valves are working. This procedure takes approximately one hour. There are no restrictions for this procedure. Please do NOT wear cologne, perfume, aftershave, or lotions (deodorant is allowed). Please arrive 15 minutes prior to your appointment time.  Please note: We ask at that you not bring children with you during ultrasound (echo/ vascular) testing. Due to room size and safety concerns, children are not allowed in the ultrasound rooms during exams. Our front office staff cannot provide observation of children in our lobby area while testing is being conducted. An adult accompanying a patient to their appointment will only be allowed in the ultrasound room at the discretion of the ultrasound technician under special circumstances. We apologize for any inconvenience.   REFERRAL TO CVTS   Follow-Up: At [facility], you and your health needs are our priority.  As part of our continuing mission to provide you with exceptional heart care, our providers are all part of one team.  This team includes your primary Cardiologist (physician) and Advanced Practice Providers or APPs  (Physician Assistants and Nurse Practitioners) who all work together to provide you with the care you need, when you need it.  Your next appointment:   3 month(s)  Provider:   Cody Das, MD    We recommend signing up for the patient portal called "MyChart".  Sign up information is provided on this After Visit Summary.  MyChart is used to connect with patients for Virtual Visits (Telemedicine).  Patients are able to view lab/test results, encounter notes, upcoming appointments, etc.  Non-urgent messages can be sent to your provider as well.   To learn more about what you can do with MyChart, go to [email or web address].

## 2023-08-19 NOTE — Progress Notes (Addendum)
 Cardiology Office Note:  .   Date:  08/19/2023  ID:  Michelle Wheeler, DOB [birth year], MRN [id number] PCP: Chandra Toribio POUR, MD  [facility] Providers Cardiologist:  Newman Lawrence, MD PCP: Chandra Toribio POUR, MD  Chief Complaint  Patient presents with   cardiac mass   Hypertension   New Patient (Initial Visit)     Michelle Wheeler is a 67 y.o. female with hypertension, prediabetes, elevated LDL, referred for evaluation of cardiac mass  Discussed the use of AI scribe software for clinical note transcription with the patient, who gave verbal consent to proceed.  History of Present Illness Michelle Wheeler is a 68 year old female who presents for evaluation of an incidental cardiac mass found during a CT scan for kidney stones.   She is asymptomatic with no chest pain or dyspnea. She has hypertension, recently managed with medication, resulting in improved blood pressure control. She is active in caring for her grandchildren and has reduced her physical activity level. She does not smoke or consume alcohol.  He is adopted, therefore does not know medical history of altered biological parents.    Vitals:   08/19/23 0949 08/19/23 0953  BP: (!) 178/77 134/74  Pulse: (!) 52 (!) 54  SpO2: 98%       Review of Systems  Cardiovascular:  Negative for chest pain, dyspnea on exertion, leg swelling, palpitations and syncope.        Studies Reviewed: SABRA        EKG 08/19/2023: Normal sinus rhythm Normal ECG No previous ECGs available    Independently interpreted 04/2023: Hb 16.1 Cr 0.66  2019: Chol 185, TG 171, HDL 40, LDL 111 HbA1C 6.3%  Independently interpreted CT hematuria workup 05/19/2023: Incidental finding of densely calcified mass in the right atrium about 2 cm in diameter.  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   Physical Exam Vitals and nursing note reviewed.  Constitutional:      General: She is not in acute distress. Neck:     Vascular: No JVD.  Cardiovascular:      Rate and Rhythm: Normal rate and regular rhythm.     Heart sounds: Murmur heard.     Systolic murmur is present with a grade of 1/6 at the lower right sternal border.  Pulmonary:     Effort: Pulmonary effort is normal.     Breath sounds: Normal breath sounds. No wheezing or rales.      VISIT DIAGNOSES:   ICD-10-CM   1. Primary hypertension  I10 EKG 12-Lead    2. Prediabetes  R73.03 HgB A1c    3. Cardiac mass  I51.89 ECHOCARDIOGRAM COMPLETE    Ambulatory referral to Cardiothoracic Surgery    4. Elevated LDL cholesterol level  E78.00 Lipid panel       Michelle Wheeler is a 68 y.o. 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 This is likely been present for several years, as evident by the dense calcification.  Otherwise, this remains metabolic risk, to her pulmonary circulation given the presence on the right atrium side. Recommend aspirin  81 mg daily in absence of any ongoing bleeding or hematuria. Obtain echocardiogram referral to see cardiothoracic surgery.  Based on echocardiogram findings, she may also need cardiac MRI for further anatomy evaluation.  Hypertension Improving on amlodipine , continue the same.  Prediabetes: A1c 6.3 in 2019.  Will check A1c now.  Elevated LDL: LDL >100 in 2019.  Will check lipid panel now.  Kidney stones Currently asymptomatic, increasing water intake.  Management as per PCP.          Meds ordered this encounter  Medications   aspirin  EC 81 MG tablet    Sig: Take 1 tablet (81 mg total) by mouth daily. Swallow whole.     F/u in 3 months  Signed, Newman JINNY Lawrence, MD

## 2023-08-20 LAB — HEMOGLOBIN A1C
Est. average glucose Bld gHb Est-mCnc: 171 mg/dL
Hgb A1c MFr Bld: 7.6 % — ABNORMAL HIGH (ref 4.8–5.6)

## 2023-08-20 LAB — LIPID PANEL
Chol/HDL Ratio: 4.5 ratio — ABNORMAL HIGH (ref 0.0–4.4)
Cholesterol, Total: 187 mg/dL (ref 100–199)
HDL: 42 mg/dL (ref >39)
LDL Chol Calc (NIH): 118 mg/dL — ABNORMAL HIGH (ref 0–99)
Triglycerides: 152 mg/dL — ABNORMAL HIGH (ref 0–149)
VLDL Cholesterol Cal: 27 mg/dL (ref 5–40)

## 2023-08-20 NOTE — Progress Notes (Signed)
 A1C is suggestive of mild diabetes and mildly elevated cholesterol.  In addition to low carb diet and regular walking, I would recommend low dose metformin 500 mg bid and statin-low dose Crestor 10 mg daily. Please discuss with Dr. Arabella Beach as well.  Thanks MJP

## 2023-08-22 ENCOUNTER — Telehealth: Payer: Self-pay

## 2023-08-22 NOTE — Telephone Encounter (Signed)
-----   Message from Laneta Pintos sent at 08/21/2023  1:55 PM EDT ----- Can you call the pt to have her schedule an appt with me for routine f/u.  Missed her appt in January and hasn't been back since ----- Message ----- From: Cody Das, MD Sent: 08/20/2023   3:12 PM EDT To: Laneta Pintos, MD; Evan Hillock, RN  A1C is suggestive of mild diabetes and mildly elevated cholesterol.  In addition to low carb diet and regular walking, I would recommend low dose metformin 500 mg bid and statin-low dose Crestor 10 mg daily. Please discuss with Dr. Arabella Beach as well.  Thanks MJP

## 2023-08-22 NOTE — Telephone Encounter (Addendum)
 LVM for return call per Dr. Arabella Beach to schedule a routine follow up since missing the appt in January.

## 2023-08-23 ENCOUNTER — Ambulatory Visit: Payer: Self-pay

## 2023-08-23 ENCOUNTER — Telehealth: Payer: Self-pay | Admitting: Cardiology

## 2023-08-23 DIAGNOSIS — I5189 Other ill-defined heart diseases: Secondary | ICD-10-CM

## 2023-08-23 MED ORDER — METFORMIN HCL 500 MG PO TABS: 500.0000 mg | ORAL_TABLET | Freq: Two times a day (BID) | ORAL | 11 refills | Status: DC

## 2023-08-23 MED ORDER — ROSUVASTATIN CALCIUM 10 MG PO TABS: 10.0000 mg | ORAL_TABLET | Freq: Every day | ORAL | 3 refills | Status: AC

## 2023-08-23 NOTE — Telephone Encounter (Signed)
Follow Up:     Patient is returning a call from yesterday, concerning her lab results. 

## 2023-08-23 NOTE — Telephone Encounter (Signed)
  Cody Das, MD 08/20/2023  3:12 PM EDT     A1C is suggestive of mild diabetes and mildly elevated cholesterol. In addition to low carb diet and regular walking, I would recommend low dose metformin 500 mg bid and statin-low dose Crestor 10 mg daily. Please discuss with Dr. Arabella Beach as well.   Thanks MJP   Spoke with the patient. Verified ID X 2.   Went over the information and recommendations above. RX for Metformin and Crestor sent to pt's preferred pharmacy. The patient reports that she has an appointment scheduled with her PCP soon as well. She verbalized understanding of all information; no questions/concerns at this time.

## 2023-08-24 NOTE — Telephone Encounter (Signed)
 Copied from CRM [id number]. Topic: Clinical - Medical Advice >> Aug 24, 2023 10:23 AM Kevelyn M wrote: Reason for CRM: Patient wanted to speak to Dr. Denette Finner about  meds that her cardiologist prescribed to her: Metformin and Rosuvastatin for blood sugar and cholesterol. Patient wants to make sure it's ok to take with her current conditions.

## 2023-08-25 NOTE — Telephone Encounter (Signed)
 Called pt she is advised of her recommendation

## 2023-08-25 NOTE — Telephone Encounter (Signed)
 Yes it is okay for her to take metformin and rosuvastatin

## 2023-09-23 ENCOUNTER — Ambulatory Visit (INDEPENDENT_AMBULATORY_CARE_PROVIDER_SITE_OTHER): Admitting: Family Medicine

## 2023-09-23 ENCOUNTER — Encounter: Payer: Self-pay | Admitting: Family Medicine

## 2023-09-23 VITALS — BP 142/84 | HR 73 | Ht 61.0 in | Wt 198.2 lb

## 2023-09-23 DIAGNOSIS — E1159 Type 2 diabetes mellitus with other circulatory complications: Secondary | ICD-10-CM

## 2023-09-23 DIAGNOSIS — D151 Benign neoplasm of heart: Secondary | ICD-10-CM | POA: Diagnosis not present

## 2023-09-23 DIAGNOSIS — I152 Hypertension secondary to endocrine disorders: Secondary | ICD-10-CM

## 2023-09-23 DIAGNOSIS — E1165 Type 2 diabetes mellitus with hyperglycemia: Secondary | ICD-10-CM

## 2023-09-23 DIAGNOSIS — E1169 Type 2 diabetes mellitus with other specified complication: Secondary | ICD-10-CM

## 2023-09-23 DIAGNOSIS — E785 Hyperlipidemia, unspecified: Secondary | ICD-10-CM | POA: Diagnosis not present

## 2023-09-23 DIAGNOSIS — R310 Gross hematuria: Secondary | ICD-10-CM | POA: Diagnosis not present

## 2023-09-23 MED ORDER — VALSARTAN 40 MG PO TABS: 40.0000 mg | ORAL_TABLET | Freq: Every day | ORAL | 3 refills | Status: AC

## 2023-09-23 MED ORDER — METFORMIN HCL ER 500 MG PO TB24: 500.0000 mg | ORAL_TABLET | Freq: Every day | ORAL | 1 refills | Status: DC

## 2023-09-23 NOTE — Progress Notes (Unsigned)
 Established Patient Office Visit  Subjective   Patient ID: Michelle Wheeler, female    DOB: [birth year]  Age: 68 y.o. MRN: [id number]  Chief Complaint  Patient presents with   Hypertension    hematuria    HPI  Subjective - Follow up for blood pressure, diabetes, and hematuria - Reports taking Metformin , Amlodipine , and cholesterol medication - Metformin  causing significant GI upset, worse when not taken with food - Amlodipine  causing dizziness and weird feeling, taking at night helps - No muscle aches from statin - Concerned about recently diagnosed myxoma, scheduled for echocardiogram on 09/30/2023 and cardiothoracic surgeon consultation on 10/07/2023 - Inquiring about exercise restrictions with myxoma - Inquiring about possibility of discontinuing diabetes medication in future  Medications: Metformin  500mg  twice daily (diabetes), Amlodipine  (hypertension), statin (cholesterol)  PMH: Diabetes mellitus (A1C 7.6), hypertension, hyperlipidemia, cardiac myxoma, nephrolithiasis with hematuria  Social Hx: Reports concern about taking medications, not previously on regular medications  ROS: Positive for GI upset with Metformin , dizziness with Amlodipine . Denies current hematuria, denies muscle aches from statin.      The 10-year ASCVD risk score (Arnett DK, et al., 2019) is: 21.7%  Health Maintenance Due  Topic Date Due   Medicare Annual Wellness (AWV)  Never done   FOOT EXAM  Never done   OPHTHALMOLOGY EXAM  Never done   Diabetic kidney evaluation - Urine ACR  Never done   MAMMOGRAM  12/05/2020   COVID-19 Vaccine (1 - 2024-25 season) Never done      Objective:     BP (!) 142/84   Pulse 73   Ht 5' 1 (1.549 m)   Wt 198 lb 4 oz (89.9 kg)   LMP 04/09/2010   SpO2 94%   BMI 37.46 kg/m    Physical Exam Gen: alert, oriented Pulm: no resp distress Psych: pleasant affect   No results found for any visits on 09/23/23.      Assessment & Plan:   Type 2 diabetes  mellitus with hyperglycemia, without long-term current use of insulin (HCC) Assessment & Plan:    - A1C 7.6 (goal <7.0)    - Current treatment with Metformin  500mg  BID causing significant GI upset    - Will switch to Metformin  XR 500mg  daily for better GI tolerance    - Counseled on diabetic diet - limit carbohydrates, especially processed foods, sugary drinks    - Encouraged exercise as tolerated    - Follow up in 3 months to reassess A1C   Hypertension associated with diabetes (HCC) Assessment & Plan:    - Currently on Amlodipine  with side effects (dizziness)    - Switch to Valsartan (ARB) which offers better renoprotection for diabetic patients    - Continue to monitor BP   Hyperlipidemia associated with type 2 diabetes mellitus (HCC) Assessment & Plan:    - Continue rosuvastatin     - Recommended CoQ10 supplement for potential muscle ache prevention    - Discussed management of statin-related myalgias if they develop   Cardiac myxoma Assessment & Plan:    - Incidentally found on imaging    - Scheduled for echocardiogram on 09/30/2023    - Cardiothoracic surgery consultation on 10/07/2023    - Discussed likelihood of surgical intervention,     - No exercise restrictions for walking and normal daily activities    - Advised to consult cardiologist regarding strenuous exercise limitations   Gross hematuria Assessment & Plan: No recent hematuria. Continue f/u per urology   Other orders -  Valsartan; Take 1 tablet (40 mg total) by mouth daily.  Dispense: 90 tablet; Refill: 3 -     metFORMIN  HCl ER; Take 1 tablet (500 mg total) by mouth daily with breakfast.  Dispense: 90 tablet; Refill: 1     Return in about 3 months (around 12/24/2023) for HTN, dm.    Laneta Pintos, MD

## 2023-09-23 NOTE — Patient Instructions (Signed)
 It was nice to see you today,  We addressed the following topics today: -Instead of your amlodipine  I would like you to try taking valsartan.  Take this once a day.  I have provided some information on it. - Instead of your metformin  I am going to prescribe metformin  XR.  This is a slightly more tolerable version of metformin .  Just take it once a day until I see you again in 3 months - In general physical exertion seems to be okay for atrial myxoma, but I would talk to your cardiologist before you engage in any strenuous or vigorous physical activity.  Have a great day,  Etha Henle, MD

## 2023-09-25 DIAGNOSIS — E1165 Type 2 diabetes mellitus with hyperglycemia: Secondary | ICD-10-CM | POA: Insufficient documentation

## 2023-09-25 DIAGNOSIS — D151 Benign neoplasm of heart: Secondary | ICD-10-CM | POA: Insufficient documentation

## 2023-09-25 DIAGNOSIS — E1169 Type 2 diabetes mellitus with other specified complication: Secondary | ICD-10-CM | POA: Insufficient documentation

## 2023-09-25 NOTE — Assessment & Plan Note (Signed)
-   Continue rosuvastatin     - Recommended CoQ10 supplement for potential muscle ache prevention    - Discussed management of statin-related myalgias if they develop

## 2023-09-25 NOTE — Assessment & Plan Note (Signed)
-   Currently on Amlodipine  with side effects (dizziness)    - Switch to Valsartan (ARB) which offers better renoprotection for diabetic patients    - Continue to monitor BP

## 2023-09-25 NOTE — Assessment & Plan Note (Signed)
 No recent hematuria. Continue f/u per urology

## 2023-09-25 NOTE — Assessment & Plan Note (Signed)
-   A1C 7.6 (goal <7.0)    - Current treatment with Metformin  500mg  BID causing significant GI upset    - Will switch to Metformin  XR 500mg  daily for better GI tolerance    - Counseled on diabetic diet - limit carbohydrates, especially processed foods, sugary drinks    - Encouraged exercise as tolerated    - Follow up in 3 months to reassess A1C

## 2023-09-25 NOTE — Assessment & Plan Note (Addendum)
-   Incidentally found on imaging    - Scheduled for echocardiogram on 09/30/2023    - Cardiothoracic surgery consultation on 10/07/2023    - Discussed likelihood of surgical intervention,     - No exercise restrictions for walking and normal daily activities    - Advised to consult cardiologist regarding strenuous exercise limitations

## 2023-09-30 ENCOUNTER — Ambulatory Visit (HOSPITAL_COMMUNITY)
Admission: RE | Admit: 2023-09-30 | Discharge: 2023-09-30 | Disposition: A | Discharge status: Home / Self Care | Source: Ambulatory Visit | Attending: Cardiology | Admitting: Cardiology

## 2023-09-30 DIAGNOSIS — I5189 Other ill-defined heart diseases: Secondary | ICD-10-CM | POA: Diagnosis not present

## 2023-09-30 LAB — ECHOCARDIOGRAM COMPLETE
Area-P 1/2: 3.89 cm2
S' Lateral: 2.7 cm

## 2023-09-30 MED ORDER — PERFLUTREN LIPID MICROSPHERE
1.0000 mL | INTRAVENOUS | Status: AC | PRN
  Administered 2023-09-30: 4 mL via INTRAVENOUS

## 2023-09-30 NOTE — Progress Notes (Signed)
 Echocardiogram shows mass, most likely myxoma. Recommend cardiac MRI and CVTS referral.  Thanks MJP

## 2023-10-04 ENCOUNTER — Encounter: Admitting: Thoracic Surgery (Cardiothoracic Vascular Surgery)

## 2023-10-07 ENCOUNTER — Encounter: Admitting: Thoracic Surgery (Cardiothoracic Vascular Surgery)

## 2023-11-18 ENCOUNTER — Ambulatory Visit: Admitting: Cardiology

## 2023-11-28 ENCOUNTER — Other Ambulatory Visit: Payer: Self-pay

## 2023-11-28 ENCOUNTER — Ambulatory Visit (HOSPITAL_COMMUNITY): Admission: RE | Admit: 2023-11-28 | Source: Ambulatory Visit

## 2023-11-28 ENCOUNTER — Encounter (HOSPITAL_COMMUNITY): Payer: Self-pay

## 2023-11-28 DIAGNOSIS — F419 Anxiety disorder, unspecified: Secondary | ICD-10-CM

## 2023-11-30 ENCOUNTER — Ambulatory Visit: Attending: Surgery | Admitting: Surgery

## 2023-11-30 VITALS — BP 183/100 | HR 78 | Resp 20 | Ht 61.0 in | Wt 203.0 lb

## 2023-11-30 DIAGNOSIS — D151 Benign neoplasm of heart: Secondary | ICD-10-CM

## 2023-11-30 MED ORDER — DIAZEPAM 5 MG PO TABS: 5.0000 mg | ORAL_TABLET | ORAL | 0 refills | Status: DC | PRN

## 2023-11-30 NOTE — Telephone Encounter (Signed)
 30 min  Thanks MJP

## 2023-11-30 NOTE — Progress Notes (Signed)
 [address], [place] [address]             [phone]     Cardiothoracic Surgery Consultation  PCP is Chandra Toribio POUR, MD Referring Provider is Elmira Newman PARAS, MD  Chief Complaint  Patient presents with    Atrial Myxoma        HPI:  The patient is a 68 year old woman with a history of hypertension, adult onset diabetes, hyperlipidemia, and kidney stones who had a CT scan of the abdomen in February 2025 for workup of hematuria which showed multiple nonobstructive bilateral kidney stones as well as an incidental 2.6 x 2.3 cm densely calcified mass within the right atrium that was felt to most likely be a calcified atrial myxoma.  She had a 2D echocardiogram on 09/30/2023 showing a calcified mass in the right atrium associated with interatrial septum the most likely to be a calcified atrial myxoma.  It measured 3.0 x 1.6 cm.  Right ventricular systolic function was normal with mild RV enlargement.  The tricuspid valve is grossly normal with trivial regurgitation.  There is moderate mitral annular calcification with trivial mitral valve regurgitation.  Left ventricular ejection fraction was 65 to 70%.  She was scheduled for cardiac MR on 12/09/2023 but was sent to me today for surgical evaluation.  She feels well and denies any symptoms.  She is here today with her sister.  Past Medical History:  Diagnosis Date   Cardiac mass    Hematuria    Low back pain    Presbyopia     No past surgical history on file.  Family History  Problem Relation Age of Onset   Diabetes Father    Cancer Father        kidney   Hyperlipidemia Father    Hypertension Father    Diabetes Paternal Uncle    Hypertension Paternal Uncle    Diabetes Mother    Diabetes Paternal Aunt    Diabetes Paternal Uncle    Hypertension Paternal Uncle    Diabetes Paternal Uncle    Hypertension Paternal Uncle     Social History Social History   Tobacco Use   Smoking status: Never    Smokeless tobacco: Never  Substance Use Topics   Alcohol use: No   Drug use: No    Current Outpatient Medications  Medication Sig Dispense Refill   aspirin  EC 81 MG tablet Take 1 tablet (81 mg total) by mouth daily. Swallow whole.     Cholecalciferol (VITAMIN D3) 2000 units TABS Take 1 tablet by mouth daily.     diazepam  (VALIUM ) 5 MG tablet Take 1 tablet (5 mg total) by mouth as needed for anxiety (for cardiac MRI). 2 tablet 0   metFORMIN  (GLUCOPHAGE -XR) 500 MG 24 hr tablet Take 1 tablet (500 mg total) by mouth daily with breakfast. 90 tablet 1   Multiple Vitamin (MULTIVITAMIN) tablet Take 1 tablet by mouth daily.     rosuvastatin  (CRESTOR ) 10 MG tablet Take 1 tablet (10 mg total) by mouth daily. 90 tablet 3   valsartan  (DIOVAN ) 40 MG tablet Take 1 tablet (40 mg total) by mouth daily. 90 tablet 3   No current facility-administered medications for this visit.    Allergies  Allergen Reactions   Codeine Other (See Comments)    hyperactivity   Morpholine Salicylate Other (See Comments)    Review of Systems  Constitutional:  Negative for activity change and fatigue.  HENT: Negative.  Eyes: Negative.   Respiratory:  Negative for shortness of breath.   Cardiovascular:  Positive for leg swelling. Negative for chest pain and palpitations.  Gastrointestinal: Negative.   Endocrine: Negative.   Genitourinary: Negative.   Musculoskeletal: Negative.   Allergic/Immunologic: Negative.   Neurological:  Negative for dizziness, seizures, syncope, weakness and numbness.  Hematological: Negative.   Psychiatric/Behavioral: Negative.      BP (!) 183/100   Pulse 78   Resp 20   Ht 5' 1 (1.549 m)   Wt 203 lb (92.1 kg)   LMP 04/09/2010   SpO2 95% Comment: RA  BMI 38.36 kg/m  Physical Exam Constitutional:      Appearance: Normal appearance. She is obese.  HENT:     Head: Normocephalic and atraumatic.  Eyes:     Extraocular Movements: Extraocular movements intact.      Conjunctiva/sclera: Conjunctivae normal.     Pupils: Pupils are equal, round, and reactive to light.  Neck:     Vascular: No carotid bruit.  Cardiovascular:     Rate and Rhythm: Normal rate and regular rhythm.     Pulses: Normal pulses.     Heart sounds: Normal heart sounds. No murmur heard. Pulmonary:     Effort: Pulmonary effort is normal.     Breath sounds: Normal breath sounds.  Abdominal:     General: There is no distension.     Tenderness: There is no abdominal tenderness.  Musculoskeletal:        General: Swelling present.  Skin:    General: Skin is warm and dry.  Neurological:     General: No focal deficit present.     Mental Status: She is alert and oriented to person, place, and time.  Psychiatric:        Mood and Affect: Mood normal.        Behavior: Behavior normal.      Diagnostic Tests:      ECHOCARDIOGRAM REPORT       Patient Name:   Michelle Wheeler  Date of Exam: 09/30/2023  Medical Rec #:  [id number]     Height:       61.0 in  Accession #:    [id number]    Weight:       198.2 lb  Date of Birth:  May 25, 1955     BSA:          1.882 m  Patient Age:    44 years      BP:           148/93 mmHg  Patient Gender: F             HR:           69 bpm.  Exam Location:  [address]   Procedure: 2D Echo, Cardiac Doppler, Color Doppler, Strain Analysis and             Intracardiac Opacification Agent (Both Spectral and Color Flow             Doppler were utilized during procedure).   Indications:    I51.89 Cardiac mass    History:        Patient has no prior history of Echocardiogram  examinations.                 Signs/Symptoms:Densely calcified mass in right atria found  by                 CT; Risk Factors:Hypertension and Obese.  Sonographer:    Nolon Berg [facility], RDCS  Referring Phys: [id number] [facility] J PATWARDHAN   IMPRESSIONS     1. Left ventricular ejection fraction, by estimation, is 65 to 70%. The  left ventricle has normal function. The left  ventricle has no regional  wall motion abnormalities. Left ventricular diastolic parameters are  consistent with Grade I diastolic  dysfunction (impaired relaxation). The average left ventricular global  longitudinal strain is -20.1 %.   2. Right ventricular systolic function is normal. The right ventricular  size is mildly enlarged.   3. There is a large, echogenic mass that appears to be associated with  the interatrial septum, which is most likely a calcified myxoma. This  measures 3.09 x 1.63 cm and is predominantly in the right atrium.   4. The mitral valve is abnormal. Trivial mitral valve regurgitation.  Moderate mitral annular calcification.   5. The aortic valve is tricuspid. Aortic valve regurgitation is not  visualized.   Comparison(s): No prior Echocardiogram.   FINDINGS   Left Ventricle: Left ventricular ejection fraction, by estimation, is 65  to 70%. The left ventricle has normal function. The left ventricle has no  regional wall motion abnormalities. Definity  contrast agent was given IV  to delineate the left ventricular   endocardial borders. The average left ventricular global longitudinal  strain is -20.1 %. The left ventricular internal cavity size was normal in  size. There is no left ventricular hypertrophy. Left ventricular diastolic  parameters are consistent with  Grade I diastolic dysfunction (impaired relaxation). Indeterminate filling  pressures.   Right Ventricle: The right ventricular size is mildly enlarged. No  increase in right ventricular wall thickness. Right ventricular systolic  function is normal.   Left Atrium: Left atrial size was normal in size.   Right Atrium: Right atrial size was normal in size.   Pericardium: There is no evidence of pericardial effusion.   Mitral Valve: The mitral valve is abnormal. Moderate mitral annular  calcification. Trivial mitral valve regurgitation.   Tricuspid Valve: The tricuspid valve is grossly normal.  Tricuspid valve  regurgitation is trivial.   Aortic Valve: The aortic valve is tricuspid. Aortic valve regurgitation is  not visualized.   Pulmonic Valve: The pulmonic valve was grossly normal. Pulmonic valve  regurgitation is trivial.   Aorta: The aortic root and ascending aorta are structurally normal, with  no evidence of dilitation.   IAS/Shunts: No atrial level shunt detected by color flow Doppler.     LEFT VENTRICLE  PLAX 2D  LVIDd:         4.50 cm   Diastology  LVIDs:         2.70 cm   LV e' medial:    7.29 cm/s  LV PW:         1.20 cm   LV E/e' medial:  13.2  LV IVS:        0.80 cm   LV e' lateral:   11.40 cm/s  LVOT diam:     1.80 cm   LV E/e' lateral: 8.5  LV SV:         73  LV SV Index:   39        2D Longitudinal Strain  LVOT Area:     2.54 cm  2D Strain GLS (A4C):   -20.3 %                           2D  Strain GLS (A3C):   -20.7 %                           2D Strain GLS (A2C):   -19.3 %                           2D Strain GLS Avg:     -20.1 %   RIGHT VENTRICLE  RV Basal diam:  4.20 cm  RV Mid diam:    3.40 cm  RV S prime:     11.20 cm/s  TAPSE (M-mode): 2.3 cm  RVSP:           28.0 mmHg   LEFT ATRIUM         Index       RIGHT ATRIUM           Index  LA diam:    4.70 cm 2.50 cm/m  RA Pressure: 3.00 mmHg                                  RA Area:     12.60 cm                                  RA Volume:   28.60 ml  15.20 ml/m   AORTIC VALVE  LVOT Vmax:   118.00 cm/s  LVOT Vmean:  81.200 cm/s  LVOT VTI:    0.285 m    AORTA  Ao Root diam: 2.40 cm  Ao Asc diam:  3.20 cm   MITRAL VALVE               TRICUSPID VALVE  MV Area (PHT): 3.89 cm    TR Peak grad:   25.0 mmHg  MV Decel Time: 195 msec    TR Vmax:        250.00 cm/s  MV E velocity: 96.40 cm/s  Estimated RAP:  3.00 mmHg  MV A velocity: 88.00 cm/s  RVSP:           28.0 mmHg  MV E/A ratio:  1.10                             SHUNTS                             Systemic VTI:  0.29 m                              Systemic Diam: 1.80 cm   Vinie Maxcy MD  Electronically signed by Vinie Maxcy MD  Signature Date/Time: 09/30/2023/1:18:12 PM        Final       Impression:  This 68 year old woman has what appears to be a 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  There is no significant valvular abnormality.  She is asymptomatic but I think surgical resection is the best treatment for this to prevent the risk of embolization, right heart failure, and further enlargement which would make surgical resection more difficult.  I reviewed the echo images with her and her sister and discussed the surgical procedure.  She is going to  have a cardiac MR study on 12/09/2023 and I will see her back after that has been completed to review the images with her and answer any further questions concerning surgical treatment.  Plan:  She will return to see me after her cardiac MR study on 12/09/2023.  I spent 60 minutes performing this consultation and > 50% of this time was spent face to face counseling and coordinating the care of this patient's calcified right atrial myxoma.  Michelle MARLA Fellers, MD [facility] [phone]

## 2023-12-06 ENCOUNTER — Telehealth: Payer: Self-pay | Admitting: Cardiology

## 2023-12-06 DIAGNOSIS — F419 Anxiety disorder, unspecified: Secondary | ICD-10-CM

## 2023-12-06 MED ORDER — DIAZEPAM 5 MG PO TABS: 5.0000 mg | ORAL_TABLET | ORAL | 0 refills | Status: DC | PRN

## 2023-12-06 NOTE — Telephone Encounter (Signed)
 Are you okay with me just calling this order into the pharmacy?

## 2023-12-06 NOTE — Telephone Encounter (Signed)
*  STAT* If patient is at the pharmacy, call can be transferred to refill team.   1. Which medications need to be refilled? (please list name of each medication and dose if known) diazepam  (VALIUM ) 5 MG tablet    2. Would you like to learn more about the convenience, safety, & potential cost savings by using the [facility]? No   3. Are you open to using the [facility] (Type [facility]. ). No   4. Which pharmacy/location (including street and city if local pharmacy) is medication to be sent to? [facility] - [place], [place] - [address]    5. Do they need a 30 day or 90 day supply? 2 pills for MRI on 8/29  Resending a request as RN sent in order on 8/20 but pharmacy never received it

## 2023-12-06 NOTE — Telephone Encounter (Signed)
 Prescription called in to [facility] per approval from Dr. Elmira.

## 2023-12-06 NOTE — Telephone Encounter (Signed)
 Yes, if they would let you do it. Just check if it has already gone through.  Thanks MJP

## 2023-12-06 NOTE — Telephone Encounter (Signed)
 Spoke with patient who is concerned about having MRI d/t claustrophobia. Advised RX for diazepam  was sent into [facility] 11/30/23 for this.  She will contact her pharmacy.

## 2023-12-06 NOTE — Telephone Encounter (Signed)
 Patient would like to discuss haivng something prescribed for claustrophobia for 8/29 MRI.

## 2023-12-07 ENCOUNTER — Telehealth (HOSPITAL_COMMUNITY): Payer: Self-pay | Admitting: Emergency Medicine

## 2023-12-07 ENCOUNTER — Telehealth (HOSPITAL_COMMUNITY): Payer: Self-pay | Admitting: *Deleted

## 2023-12-07 NOTE — Telephone Encounter (Signed)
 Attempted to call patient regarding upcoming cardiac MRI appointment. Left message on voicemail with name and callback number  Chantal Requena RN Navigator Cardiac Imaging [facility] [phone] Office [phone] Cell  Reminder for labs.

## 2023-12-07 NOTE — Telephone Encounter (Signed)
 Reaching out to patient to offer assistance regarding upcoming cardiac imaging study; pt verbalizes understanding of appt date/time, parking situation and where to check in, pre-test NPO status and medications ordered, and verified current allergies; name and call back number provided for further questions should they arise Rockwell Alexandria RN Navigator Cardiac Imaging [facility] [phone] office [phone] cell

## 2023-12-09 ENCOUNTER — Ambulatory Visit: Admitting: Cardiology

## 2023-12-09 ENCOUNTER — Ambulatory Visit (HOSPITAL_COMMUNITY): Admission: RE | Admit: 2023-12-09 | Source: Ambulatory Visit

## 2023-12-09 ENCOUNTER — Encounter (HOSPITAL_COMMUNITY): Payer: Self-pay

## 2023-12-15 ENCOUNTER — Ambulatory Visit: Admitting: Surgery

## 2023-12-30 ENCOUNTER — Ambulatory Visit: Admitting: Family Medicine

## 2024-01-11 ENCOUNTER — Encounter (HOSPITAL_COMMUNITY): Payer: Self-pay

## 2024-01-11 ENCOUNTER — Ambulatory Visit: Admitting: Surgery

## 2024-01-13 ENCOUNTER — Ambulatory Visit (HOSPITAL_COMMUNITY)
Admission: RE | Admit: 2024-01-13 | Discharge: 2024-01-13 | Disposition: A | Discharge status: Home / Self Care | Source: Ambulatory Visit | Attending: Cardiology | Admitting: Cardiology

## 2024-01-13 ENCOUNTER — Other Ambulatory Visit: Payer: Self-pay | Admitting: Cardiology

## 2024-01-13 DIAGNOSIS — I5189 Other ill-defined heart diseases: Secondary | ICD-10-CM | POA: Diagnosis present

## 2024-01-13 MED ORDER — GADOBUTROL 1 MMOL/ML IV SOLN
7.2000 mL | Freq: Once | INTRAVENOUS | Status: AC | PRN
  Administered 2024-01-13: 7.2 mL via INTRAVENOUS

## 2024-01-16 ENCOUNTER — Ambulatory Visit: Payer: Self-pay | Admitting: Cardiology

## 2024-01-16 NOTE — Progress Notes (Signed)
 So myxoma clearly excluded, correct?  Thanks [employer]

## 2024-01-18 NOTE — Progress Notes (Signed)
 Dr. Lucas, this is a mutual patient you saw in August for possible myxoma. Above is his MRI report. I am seeing her on 10/17. What are your thoughts?  Thanks [employer]

## 2024-01-23 NOTE — Progress Notes (Signed)
 I agree. I will discuss with her during office visit later this week and have her follow up with you.  Thanks [employer]

## 2024-01-27 ENCOUNTER — Ambulatory Visit: Admitting: Cardiology

## 2024-02-01 ENCOUNTER — Encounter: Payer: Self-pay | Admitting: Surgery

## 2024-02-01 ENCOUNTER — Ambulatory Visit: Attending: Surgery | Admitting: Surgery

## 2024-02-01 VITALS — BP 162/99 | HR 88 | Resp 18 | Ht 61.0 in | Wt 202.0 lb

## 2024-02-01 DIAGNOSIS — D151 Benign neoplasm of heart: Secondary | ICD-10-CM | POA: Diagnosis not present

## 2024-02-01 NOTE — Progress Notes (Unsigned)
   [address], [place] [address]             [phone]    HPI: ***  Current Outpatient Medications  Medication Sig Dispense Refill   aspirin  EC 81 MG tablet Take 1 tablet (81 mg total) by mouth daily. Swallow whole.     Cholecalciferol (VITAMIN D3) 2000 units TABS Take 1 tablet by mouth daily.     metFORMIN  (GLUCOPHAGE -XR) 500 MG 24 hr tablet Take 1 tablet (500 mg total) by mouth daily with breakfast. 90 tablet 1   Multiple Vitamin (MULTIVITAMIN) tablet Take 1 tablet by mouth daily.     rosuvastatin  (CRESTOR ) 10 MG tablet Take 1 tablet (10 mg total) by mouth daily. 90 tablet 3   valsartan  (DIOVAN ) 40 MG tablet Take 1 tablet (40 mg total) by mouth daily. 90 tablet 3   No current facility-administered medications for this visit.     Physical Exam: ***  Diagnostic Tests: ***  Impression: ***  Plan: ***   Dorise MARLA Fellers, MD [facility] [phone]

## 2024-02-24 ENCOUNTER — Ambulatory Visit: Admitting: Cardiology

## 2024-03-20 ENCOUNTER — Other Ambulatory Visit: Payer: Self-pay | Admitting: Family Medicine

## 2024-03-20 MED ORDER — METFORMIN HCL ER 500 MG PO TB24: 500.0000 mg | ORAL_TABLET | Freq: Every day | ORAL | 1 refills | Status: DC

## 2024-03-20 NOTE — Telephone Encounter (Signed)
 Copied from CRM #[id number]. Topic: Clinical - Medication Refill >> Mar 20, 2024  9:13 AM Michelle Wheeler wrote: Medication: metFORMIN  (GLUCOPHAGE -XR) 500 MG 24 hr tablet  Has the patient contacted their pharmacy? Yes, call dr  This is the patient's preferred pharmacy:  [facility] - [place], [place] - [address] [place] [address] Phone: [phone] Fax: [phone]  Is this the correct pharmacy for this prescription? Yes If no, delete pharmacy and type the correct one.   Has the prescription been filled recently? No  Is the patient out of the medication? No  Has the patient been seen for an appointment in the last year OR does the patient have an upcoming appointment? Yes  Can we respond through MyChart? Yes  Agent: Please be advised that Rx refills may take up to 3 business days. We ask that you follow-up with your pharmacy.

## 2024-03-21 ENCOUNTER — Telehealth: Payer: Self-pay

## 2024-03-21 NOTE — Telephone Encounter (Signed)
 Spoke to patient and let her know that I found a for sure fax number to [facility].  Form is folder for pick up with confirmation

## 2024-03-21 NOTE — Telephone Encounter (Signed)
 Copied from CRM #[id number]. Topic: General - Other >> Mar 21, 2024  4:49 PM Zebedee SAUNDERS wrote: Reason for CRM: Pt will come by office to pick up insurance paperwork on Monday  Dec. 15th.

## 2024-03-26 ENCOUNTER — Other Ambulatory Visit: Payer: Self-pay | Admitting: *Deleted

## 2024-03-26 ENCOUNTER — Telehealth: Payer: Self-pay | Admitting: *Deleted

## 2024-03-26 MED ORDER — METFORMIN HCL ER 500 MG PO TB24: 500.0000 mg | ORAL_TABLET | Freq: Every day | ORAL | 1 refills | Status: AC

## 2024-03-26 NOTE — Telephone Encounter (Signed)
 Copied from CRM #[id number]. Topic: Clinical - Medication Question >> Mar 23, 2024 12:55 PM Michelle Wheeler wrote: Reason for CRM: Pt stated that she needs the refill for the metFORMIN  (GLUCOPHAGE -XR) 500 MG 24 hr tablet resent to the correct pharmacy. Pt stated that she requested the refill to be sent to the [facility] pharmacy but [facility]. Pt would like to have it sent to [facility] today if possible and would like a callback with an update.    ----------------------------------------------------------------------- From previous Reason for Contact - Medication Refill: Medication:   Has the patient contacted their pharmacy?   (Agent: If no, request that the patient contact the pharmacy for the refill. If patient does not wish to contact the pharmacy document the reason why and proceed with request.) (Agent: If yes, when and what did the pharmacy advise?)  This is the patient's preferred pharmacy:  [id number] [place], [place] - [address] [place] [address] Phone: [phone] Fax: [phone]  [facility] - [place], [place] - [address] [place] [address] Phone: [phone] Fax: [phone]  Is this the correct pharmacy for this prescription?   If no, delete pharmacy and type the correct one.   Has the prescription been filled recently?    Is the patient out of the medication?    Has the patient been seen for an appointment in the last year OR does the patient have an upcoming appointment?    Can we respond through MyChart?    Agent: Please be advised that Rx refills may take up to 3 business days. We ask that you follow-up with your pharmacy.

## 2024-03-26 NOTE — Telephone Encounter (Signed)
Rx has been sent to requested pharmacy 

## 2024-04-11 ENCOUNTER — Ambulatory Visit: Admitting: Family Medicine

## 2024-04-18 ENCOUNTER — Ambulatory Visit: Admitting: Family Medicine

## 2024-05-11 ENCOUNTER — Ambulatory Visit: Admitting: Cardiology

## 2024-06-08 ENCOUNTER — Ambulatory Visit: Admitting: Emergency Medicine
